# Patient Record
Sex: Female | Born: 1950 | Race: White | Hispanic: No | Marital: Single | Smoking: Current some day smoker
Health system: Southern US, Community
[De-identification: ages and names within clinical notes are randomized; demographics above are authoritative.]

## PROBLEM LIST (undated history)

## (undated) DIAGNOSIS — C3490 Malignant neoplasm of unspecified part of unspecified bronchus or lung: Secondary | ICD-10-CM

## (undated) DIAGNOSIS — B351 Tinea unguium: Secondary | ICD-10-CM

## (undated) DIAGNOSIS — L84 Corns and callosities: Secondary | ICD-10-CM

## (undated) DIAGNOSIS — E785 Hyperlipidemia, unspecified: Secondary | ICD-10-CM

## (undated) DIAGNOSIS — E079 Disorder of thyroid, unspecified: Secondary | ICD-10-CM

## (undated) DIAGNOSIS — E119 Type 2 diabetes mellitus without complications: Secondary | ICD-10-CM

## (undated) DIAGNOSIS — H04129 Dry eye syndrome of unspecified lacrimal gland: Secondary | ICD-10-CM

## (undated) DIAGNOSIS — H40023 Open angle with borderline findings, high risk, bilateral: Secondary | ICD-10-CM

## (undated) DIAGNOSIS — M1990 Unspecified osteoarthritis, unspecified site: Secondary | ICD-10-CM

## (undated) DIAGNOSIS — F319 Bipolar disorder, unspecified: Secondary | ICD-10-CM

## (undated) DIAGNOSIS — I10 Essential (primary) hypertension: Secondary | ICD-10-CM

## (undated) DIAGNOSIS — E7800 Pure hypercholesterolemia, unspecified: Secondary | ICD-10-CM

## (undated) HISTORY — DX: Malignant neoplasm of unspecified part of unspecified bronchus or lung: C34.90

## (undated) HISTORY — DX: Corns and callosities: L84

## (undated) HISTORY — DX: Hyperlipidemia, unspecified: E78.5

## (undated) HISTORY — DX: Tinea unguium: B35.1

## (undated) HISTORY — DX: Essential (primary) hypertension: I10

## (undated) HISTORY — PX: KNEE REPLACEMENT: SHX530B

## (undated) HISTORY — DX: Pure hypercholesterolemia, unspecified: E78.00

## (undated) HISTORY — DX: Dry eye syndrome of unspecified lacrimal gland: H04.129

## (undated) HISTORY — DX: Disorder of thyroid, unspecified: E07.9

## (undated) HISTORY — PX: CHOLECYSTECTOMY: SHX55

## (undated) HISTORY — PX: ECTOPIC PREGNANCY SURGERY: SHX613

## (undated) HISTORY — DX: Unspecified osteoarthritis, unspecified site: M19.90

## (undated) HISTORY — DX: Open angle with borderline findings, high risk, bilateral: H40.023

## (undated) HISTORY — PX: OTHER SURGICAL HISTORY: SHX169

## (undated) HISTORY — PX: TOE SURGERY: SHX1073

## (undated) HISTORY — DX: Type 2 diabetes mellitus without complications: E11.9

## (undated) HISTORY — DX: Bipolar disorder, unspecified: F31.9

## (undated) HISTORY — PX: CYST REMOVAL: SHX22

---

## 1988-08-01 DIAGNOSIS — S069XAA Unspecified intracranial injury with loss of consciousness status unknown, initial encounter: Secondary | ICD-10-CM

## 1988-08-01 HISTORY — DX: Unspecified intracranial injury with loss of consciousness status unknown, initial encounter: S06.9XAA

## 2010-03-12 ENCOUNTER — Encounter: Payer: Self-pay | Admitting: Gastroenterology

## 2010-03-12 ENCOUNTER — Ambulatory Visit: Payer: Self-pay | Admitting: Orthopedic Surgery

## 2010-03-26 ENCOUNTER — Ambulatory Visit: Payer: Self-pay | Admitting: Orthopedic Surgery

## 2010-04-23 ENCOUNTER — Ambulatory Visit: Payer: Self-pay | Admitting: Orthopedic Surgery

## 2010-04-28 ENCOUNTER — Ambulatory Visit: Payer: Self-pay | Admitting: Orthopedic Surgery

## 2010-08-01 HISTORY — PX: LUNG BIOPSY: SHX232

## 2010-08-01 HISTORY — PX: LUNG CANCER SURGERY: SHX702

## 2010-08-10 NOTE — Miscellaneous (Unsigned)
Continuity of Care Record  Created: todo  From: SHAHID, NASHIHA  From:   From: [employer], EHR v10.2.7.53  To: Ricki Miller  Purpose: Patient Use;       Medications  Ambien CR 12.5 MG Tablet Extended Release ; RPT   Avalide 150-12.5 MG Tablet ; RPT   Avapro 150 MG Tablet ; RPT   Byetta 10 MCG Pen 10 MCG/0.04ML Solution ; RPT   Calcium 600+D 600-400 MG-UNIT Tablet ; RPT   Cymbalta 60 MG Capsule Delayed Release Particles ; RPT   Glimepiride 2 MG Tablet ; RPT   Hydrochlorothiazide 12.5 MG Capsule ; RPT   Hydrocodone-Acetaminophen 10-325 MG Tablet ; RPT   Hydrocodone-Acetaminophen 10-500 MG Tablet ; RPT   LaMICtal Starter 25 (42)-100 (7) MG Kit ; RPT   Levothyroxine Sodium 112 MCG Tablet ; RPT   Maxalt-MLT 10 MG Tablet Dispersible ; RPT   MetFORMIN HCl 500 MG Tablet ; RPT   NovoFine 32G X 6 MM Miscellaneous ; RPT   Opana ER 10 MG Tablet Extended Release 12 Hour ; RPT   Opana ER 20 MG Tablet Extended Release 12 Hour ; RPT   Ropinirole HCl 1 MG Tablet ; RPT   Ropinirole HCl 2 MG Tablet ; RPT   Topiramate 100 MG Tablet ; RPT   Tricor 145 MG Tablet ; RPT   Vitamin D (Ergocalciferol) 50000 UNIT Capsule ; RPT   Warfarin Sodium 1 MG Tablet ; RPT   Warfarin Sodium 3 MG Tablet ; RPT   Warfarin Sodium 5 MG Tablet ; RPT   Zetia 10 MG Tablet ; RPT

## 2012-07-03 ENCOUNTER — Ambulatory Visit: Payer: Self-pay | Admitting: Surgery

## 2013-12-05 DIAGNOSIS — I080 Rheumatic disorders of both mitral and aortic valves: Secondary | ICD-10-CM | POA: Insufficient documentation

## 2015-09-03 DIAGNOSIS — R079 Chest pain, unspecified: Secondary | ICD-10-CM | POA: Insufficient documentation

## 2015-09-03 DIAGNOSIS — R0600 Dyspnea, unspecified: Secondary | ICD-10-CM | POA: Insufficient documentation

## 2016-09-14 DIAGNOSIS — Z8249 Family history of ischemic heart disease and other diseases of the circulatory system: Secondary | ICD-10-CM | POA: Insufficient documentation

## 2018-01-07 DIAGNOSIS — R7989 Other specified abnormal findings of blood chemistry: Secondary | ICD-10-CM | POA: Insufficient documentation

## 2018-01-07 DIAGNOSIS — E042 Nontoxic multinodular goiter: Secondary | ICD-10-CM | POA: Insufficient documentation

## 2018-02-21 DIAGNOSIS — R42 Dizziness and giddiness: Secondary | ICD-10-CM | POA: Insufficient documentation

## 2018-03-28 DIAGNOSIS — R918 Other nonspecific abnormal finding of lung field: Secondary | ICD-10-CM | POA: Insufficient documentation

## 2018-06-21 DIAGNOSIS — H6123 Impacted cerumen, bilateral: Secondary | ICD-10-CM | POA: Insufficient documentation

## 2018-06-21 DIAGNOSIS — J309 Allergic rhinitis, unspecified: Secondary | ICD-10-CM | POA: Insufficient documentation

## 2018-09-25 ENCOUNTER — Encounter: Payer: Self-pay | Admitting: Podiatry

## 2018-09-25 ENCOUNTER — Other Ambulatory Visit: Payer: Self-pay

## 2018-09-25 ENCOUNTER — Ambulatory Visit (INDEPENDENT_AMBULATORY_CARE_PROVIDER_SITE_OTHER): Payer: Managed Care, Other (non HMO) | Admitting: Podiatry

## 2018-09-25 VITALS — BP 105/78 | HR 91

## 2018-09-25 DIAGNOSIS — M79675 Pain in left toe(s): Secondary | ICD-10-CM

## 2018-09-25 DIAGNOSIS — B351 Tinea unguium: Secondary | ICD-10-CM

## 2018-09-25 DIAGNOSIS — M79674 Pain in right toe(s): Secondary | ICD-10-CM

## 2018-09-25 DIAGNOSIS — L84 Corns and callosities: Secondary | ICD-10-CM

## 2018-09-25 NOTE — Patient Instructions (Signed)
Onychomycosis/Fungal Toenails  WHAT IS IT? An infection that lies within the keratin of your nail plate that is caused by a fungus.  WHY ME? Fungal infections affect all ages, sexes, races, and creeds.  There may be many factors that predispose you to a fungal infection such as age, coexisting medical conditions such as diabetes, or an autoimmune disease; stress, medications, fatigue, genetics, etc.  Bottom line: fungus thrives in a warm, moist environment and your shoes offer such a location.  IS IT CONTAGIOUS? Theoretically, yes.  You do not want to share shoes, nail clippers or files with someone who has fungal toenails.  Walking around barefoot in the same room or sleeping in the same bed is unlikely to transfer the organism.  It is important to realize, however, that fungus can spread easily from one nail to the next on the same foot.  HOW DO WE TREAT THIS?  There are several ways to treat this condition.  Treatment may depend on many factors such as age, medications, pregnancy, liver and kidney conditions, etc.  It is best to ask your doctor which options are available to you.  1. No treatment.   Unlike many other medical concerns, you can live with this condition.  However for many people this can be a painful condition and may lead to ingrown toenails or a bacterial infection.  It is recommended that you keep the nails cut short to help reduce the amount of fungal nail. 2. Topical treatment.  These range from herbal remedies to prescription strength nail lacquers.  About 40-50% effective, topicals require twice daily application for approximately 9 to 12 months or until an entirely new nail has grown out.  The most effective topicals are medical grade medications available through physicians offices. 3. Oral antifungal medications.  With an 80-90% cure rate, the most common oral medication requires 3 to 4 months of therapy and stays in your system for a year as the new nail grows out.  Oral  antifungal medications do require blood work to make sure it is a safe drug for you.  A liver function panel will be performed prior to starting the medication and after the first month of treatment.  It is important to have the blood work performed to avoid any harmful side effects.  In general, this medication safe but blood work is required. 4. Laser Therapy.  This treatment is performed by applying a specialized laser to the affected nail plate.  This therapy is noninvasive, fast, and non-painful.  It is not covered by insurance and is therefore, out of pocket.  The results have been very good with a 80-95% cure rate.  [facility] is the only practice in the area to offer this therapy. Permanent Nail Avulsion.  Removing the entire nail so that a new nail will not grow back.Corns and Calluses Corns are small areas of thickened skin that occur on the top, sides, or tip of a toe. They contain a cone-shaped core with a point that can press on a nerve below. This causes pain.  Calluses are areas of thickened skin that can occur anywhere on the body, including the hands, fingers, palms, soles of the feet, and heels. Calluses are usually larger than corns. What are the causes? Corns and calluses are caused by rubbing (friction) or pressure, such as from shoes that are too tight or do not fit properly. What increases the risk? Corns are more likely to develop in people who have misshapen   toes (toe deformities), such as hammer toes. Calluses can occur with friction to any area of the skin. They are more likely to develop in people who:  Work with their hands.  Wear shoes that fit poorly, are too tight, or are high-heeled.  Have toe deformities. What are the signs or symptoms? Symptoms of a corn or callus include:  A hard growth on the skin.  Pain or tenderness under the skin.  Redness and swelling.  Increased discomfort while wearing tight-fitting shoes, if your feet are affected. If a  corn or callus becomes infected, symptoms may include:  Redness and swelling that gets worse.  Pain.  Fluid, blood, or pus draining from the corn or callus. How is this diagnosed? Corns and calluses may be diagnosed based on your symptoms, your medical history, and a physical exam. How is this treated? Treatment for corns and calluses may include:  Removing the cause of the friction or pressure. This may involve: ? Changing your shoes. ? Wearing shoe inserts (orthotics) or other protective layers in your shoes, such as a corn pad. ? Wearing gloves.  Applying medicine to the skin (topical medicine) to help soften skin in the hardened, thickened areas.  Removing layers of dead skin with a file to reduce the size of the corn or callus.  Removing the corn or callus with a scalpel or laser.  Taking antibiotic medicines, if your corn or callus is infected.  Having surgery, if a toe deformity is the cause. Follow these instructions at home:   Take over-the-counter and prescription medicines only as told by your health care provider.  If you were prescribed an antibiotic, take it as told by your health care provider. Do not stop taking it even if your condition starts to improve.  Wear shoes that fit well. Avoid wearing high-heeled shoes and shoes that are too tight or too loose.  Wear any padding, protective layers, gloves, or orthotics as told by your health care provider.  Soak your hands or feet and then use a file or pumice stone to soften your corn or callus. Do this as told by your health care provider.  Check your corn or callus every day for symptoms of infection. Contact a health care provider if you:  Notice that your symptoms do not improve with treatment.  Have redness or swelling that gets worse.  Notice that your corn or callus becomes painful.  Have fluid, blood, or pus coming from your corn or callus.  Have new symptoms. Summary  Corns are small areas of  thickened skin that occur on the top, sides, or tip of a toe.  Calluses are areas of thickened skin that can occur anywhere on the body, including the hands, fingers, palms, and soles of the feet. Calluses are usually larger than corns.  Corns and calluses are caused by rubbing (friction) or pressure, such as from shoes that are too tight or do not fit properly.  Treatment may include wearing any padding, protective layers, gloves, or orthotics as told by your health care provider. This information is not intended to replace advice given to you by your health care provider. Make sure you discuss any questions you have with your health care provider. Document Released: 04/23/2004 Document Revised: 05/31/2017 Document Reviewed: 05/31/2017 Elsevier Interactive Patient Education  2019 Elsevier Inc.  

## 2018-10-01 ENCOUNTER — Encounter: Payer: Self-pay | Admitting: Podiatry

## 2018-10-01 DIAGNOSIS — B351 Tinea unguium: Secondary | ICD-10-CM

## 2018-10-01 DIAGNOSIS — L84 Corns and callosities: Secondary | ICD-10-CM

## 2018-10-01 HISTORY — DX: Tinea unguium: B35.1

## 2018-10-01 HISTORY — DX: Corns and callosities: L84

## 2018-10-01 NOTE — Progress Notes (Signed)
Subjective: Mindy King presents today for further evaluation.  Patient presents with painful calluses of both feet which she has had for decades.  She was receiving podiatric care in [place] periodically for her painful calluses.  She has moved to [place] from [place] to be closer to her family.  Patient states she has history of lung cancer diagnosed approximately 8 years ago.  She had no chemotherapy and no radiation.  She underwent surgery to remove her lung cancer in 2012.  She states she is open and honest and does have a cigarette still every now and again.    Current Outpatient Medications:  .  albuterol (PROVENTIL HFA;VENTOLIN HFA) 108 (90 Base) MCG/ACT inhaler, Inhale into the lungs every 6 (six) hours as needed for wheezing or shortness of breath., Disp: , Rfl:  .  atorvastatin (LIPITOR) 20 MG tablet, Take 20 mg by mouth daily., Disp: , Rfl:  .  montelukast (SINGULAIR) 10 MG tablet, Take 10 mg by mouth at bedtime., Disp: , Rfl:  .  Multiple Vitamin (DAILY-VITE) TABS, TK 1 T PO QD, Disp: , Rfl:  .  SPIRIVA HANDIHALER 18 MCG inhalation capsule, PLACE 1 C INTO INHALER AND INL D, Disp: , Rfl:    Allergies  Allergen Reactions  . Ampicillin      Social History   Occupational History  . Not on file  Tobacco Use  . Smoking status: Not on file  Substance and Sexual Activity  . Alcohol use: Not on file  . Drug use: Not on file  . Sexual activity: Not on file     History reviewed. No pertinent family history.    There is no immunization history on file for this patient.   Review of systems: Positive Findings in bold print.  Constitutional:  chills, fatigue, fever, sweats, weight change Communication: Optometrist, sign Ecologist, hand writing, iPad/Android device Head: headaches, head injury Eyes: changes in vision, eye pain, glaucoma, cataracts, macular degeneration, diplopia, glare,  light sensitivity, eyeglasses or contacts, blindness Ears nose mouth  throat: Hard of hearing, ringing in ears, deaf, sign language,  vertigo,   nosebleeds,  rhinitis,  cold sores, snoring, swollen glands Cardiovascular: HTN, edema, arrhythmia, pacemaker in place, defibrillator in place,  chest pain/tightness, chronic anticoagulation, blood clot, heart failure Peripheral Vascular: leg cramps, varicose veins, blood clots, lymphedema Respiratory:  difficulty breathing, denies congestion, SOB, wheezing, cough, emphysema Gastrointestinal: change in appetite or weight, abdominal pain, constipation, diarrhea, nausea, vomiting, vomiting blood, change in bowel habits, abdominal pain, jaundice, rectal bleeding, hemorrhoids, Genitourinary:  nocturia,  pain on urination,  blood in urine, Foley catheter, urinary urgency Musculoskeletal: uses mobility aid,  cramping, stiff joints, painful joints, decreased joint motion, fractures, OA, gout Skin: +changes in toenails, color change, dryness, itching, mole changes,  rash, multiple corns/calluses both feet Neurological: headaches, numbness in feet, paresthesias in feet, burning in feet, fainting,  seizures, change in speech. denies headaches, memory problems/poor historian, cerebral palsy, weakness, paralysis Endocrine: diabetes, hypothyroidism, hyperthyroidism,  goiter, dry mouth, flushing, heat intolerance,  cold intolerance,  excessive thirst, denies polyuria,  nocturia Hematological:  easy bleeding, excessive bleeding, easy bruising, enlarged lymph nodes, on long term blood thinner, history of past transusions Allergy/immunological:  hives, eczema, frequent infections, multiple drug allergies, seasonal allergies, transplant recipient Psychiatric:  anxiety, depression, mood disorder, suicidal ideations, hallucinations   Objective: Vitals:   09/25/18 1521  BP: 105/78  Pulse: 91   Vascular Examination: Capillary refill time <3 seconds x 10 digits  Dorsalis  pedis and posterior tibial pulses present b/l.  Sparse digital hair x  10 digits.  Skin temperature warm to warm b/l.  Dermatological Examination: Skin with normal turgor, texture and tone.  Toenails noted to be thickened, discolored, dystrophic with subungual debris x10.  There is tenderness to dorsal palpation.  Multiple hyperkeratotic lesions noted medial aspect left second digit, submetatarsal head 4 right foot, sub-hallux bilaterally, submetatarsal head 1 right foot, medial and lateral aspect of right second digit, medial aspect of right third digit.  There is tenderness palpation of all of these lesions, especially plantar lesions.  There is no erythema, no edema, no drainage, no flocculence noted.  Musculoskeletal: Muscle strength 5/5 to all LE muscle groups.  Hammertoe deformity digits 2 through 4 bilaterally.  Hallux abductovalgus with bunion deformity bilaterally.  Neurological: Sensation intact with 10 gram monofilament. Vibratory sensation intact.  Assessment: 1. Painful mycotic toenails 1 through 5 bilaterally 2. Painful calluses submetatarsal head 4 right, sub-hallux bilaterally, submetatarsal head 1 right foot 3. Painful corns bilateral second digits and right third digit  Plan: 1. Discussed diagnoses and treatment plans available.  Patient gave verbal verbal consent for debridement of painful toenails and painful corns and calluses. 2. Toenails 1 through 5 bilaterally debrided in length and girth without incident. 3. Painful calluses submetatarsal head 4 right foot sub-hallux bilaterally submetatarsal head 1 right foot pared with sterile scalpel blade.  Light bleeding to lesion submetatarsal head 4 right foot addressed with Lumicain hemostatic solution.  Antibiotic ointment applied to this area.  Painful corns bilateral second digits and right third digit pared with sterile scalpel blade without incident. 4. Patient to continue soft, supportive shoe gear daily. 5. Patient to report any pedal injuries to medical professional  immediately. 6. Follow up 9 weeks.  Patient/POA to call should there be a concern in the interim.

## 2018-11-29 ENCOUNTER — Ambulatory Visit: Payer: Managed Care, Other (non HMO) | Admitting: Podiatry

## 2018-12-13 DIAGNOSIS — Z720 Tobacco use: Secondary | ICD-10-CM | POA: Insufficient documentation

## 2018-12-13 DIAGNOSIS — E782 Mixed hyperlipidemia: Secondary | ICD-10-CM | POA: Insufficient documentation

## 2018-12-13 DIAGNOSIS — E041 Nontoxic single thyroid nodule: Secondary | ICD-10-CM | POA: Insufficient documentation

## 2018-12-13 DIAGNOSIS — C3432 Malignant neoplasm of lower lobe, left bronchus or lung: Secondary | ICD-10-CM | POA: Insufficient documentation

## 2018-12-13 DIAGNOSIS — J439 Emphysema, unspecified: Secondary | ICD-10-CM | POA: Insufficient documentation

## 2018-12-25 DIAGNOSIS — J387 Other diseases of larynx: Secondary | ICD-10-CM | POA: Insufficient documentation

## 2018-12-31 ENCOUNTER — Ambulatory Visit (INDEPENDENT_AMBULATORY_CARE_PROVIDER_SITE_OTHER): Payer: Commercial Indemnity | Admitting: Podiatry

## 2018-12-31 ENCOUNTER — Encounter: Payer: Self-pay | Admitting: Podiatry

## 2018-12-31 DIAGNOSIS — M79674 Pain in right toe(s): Secondary | ICD-10-CM

## 2018-12-31 DIAGNOSIS — M79675 Pain in left toe(s): Secondary | ICD-10-CM | POA: Diagnosis not present

## 2018-12-31 DIAGNOSIS — L84 Corns and callosities: Secondary | ICD-10-CM | POA: Diagnosis not present

## 2018-12-31 DIAGNOSIS — B351 Tinea unguium: Secondary | ICD-10-CM

## 2018-12-31 NOTE — Patient Instructions (Signed)
Corns and Calluses Corns are small areas of thickened skin that occur on the top, sides, or tip of a toe. They contain a cone-shaped core with a point that can press on a nerve below. This causes pain.  Calluses are areas of thickened skin that can occur anywhere on the body, including the hands, fingers, palms, soles of the feet, and heels. Calluses are usually larger than corns. What are the causes? Corns and calluses are caused by rubbing (friction) or pressure, such as from shoes that are too tight or do not fit properly. What increases the risk? Corns are more likely to develop in people who have misshapen toes (toe deformities), such as hammer toes. Calluses can occur with friction to any area of the skin. They are more likely to develop in people who:  Work with their hands.  Wear shoes that fit poorly, are too tight, or are high-heeled.  Have toe deformities. What are the signs or symptoms? Symptoms of a corn or callus include:  A hard growth on the skin.  Pain or tenderness under the skin.  Redness and swelling.  Increased discomfort while wearing tight-fitting shoes, if your feet are affected. If a corn or callus becomes infected, symptoms may include:  Redness and swelling that gets worse.  Pain.  Fluid, blood, or pus draining from the corn or callus. How is this diagnosed? Corns and calluses may be diagnosed based on your symptoms, your medical history, and a physical exam. How is this treated? Treatment for corns and calluses may include:  Removing the cause of the friction or pressure. This may involve: ? Changing your shoes. ? Wearing shoe inserts (orthotics) or other protective layers in your shoes, such as a corn pad. ? Wearing gloves.  Applying medicine to the skin (topical medicine) to help soften skin in the hardened, thickened areas.  Removing layers of dead skin with a file to reduce the size of the corn or callus.  Removing the corn or callus with a  scalpel or laser.  Taking antibiotic medicines, if your corn or callus is infected.  Having surgery, if a toe deformity is the cause. Follow these instructions at home:   Take over-the-counter and prescription medicines only as told by your health care provider.  If you were prescribed an antibiotic, take it as told by your health care provider. Do not stop taking it even if your condition starts to improve.  Wear shoes that fit well. Avoid wearing high-heeled shoes and shoes that are too tight or too loose.  Wear any padding, protective layers, gloves, or orthotics as told by your health care provider.  Soak your hands or feet and then use a file or pumice stone to soften your corn or callus. Do this as told by your health care provider.  Check your corn or callus every day for symptoms of infection. Contact a health care provider if you:  Notice that your symptoms do not improve with treatment.  Have redness or swelling that gets worse.  Notice that your corn or callus becomes painful.  Have fluid, blood, or pus coming from your corn or callus.  Have new symptoms. Summary  Corns are small areas of thickened skin that occur on the top, sides, or tip of a toe.  Calluses are areas of thickened skin that can occur anywhere on the body, including the hands, fingers, palms, and soles of the feet. Calluses are usually larger than corns.  Corns and calluses are caused by   rubbing (friction) or pressure, such as from shoes that are too tight or do not fit properly.  Treatment may include wearing any padding, protective layers, gloves, or orthotics as told by your health care provider. This information is not intended to replace advice given to you by your health care provider. Make sure you discuss any questions you have with your health care provider. Document Released: 04/23/2004 Document Revised: 05/31/2017 Document Reviewed: 05/31/2017 Elsevier Interactive Patient Education   2019 [employer] Inc.  Onychomycosis/Fungal Toenails  WHAT IS IT? An infection that lies within the keratin of your nail plate that is caused by a fungus.  WHY ME? Fungal infections affect all ages, sexes, races, and creeds.  There may be many factors that predispose you to a fungal infection such as age, coexisting medical conditions such as diabetes, or an autoimmune disease; stress, medications, fatigue, genetics, etc.  Bottom line: fungus thrives in a warm, moist environment and your shoes offer such a location.  IS IT CONTAGIOUS? Theoretically, yes.  You do not want to share shoes, nail clippers or files with someone who has fungal toenails.  Walking around barefoot in the same room or sleeping in the same bed is unlikely to transfer the organism.  It is important to realize, however, that fungus can spread easily from one nail to the next on the same foot.  HOW DO WE TREAT THIS?  There are several ways to treat this condition.  Treatment may depend on many factors such as age, medications, pregnancy, liver and kidney conditions, etc.  It is best to ask your doctor which options are available to you.  1. No treatment.   Unlike many other medical concerns, you can live with this condition.  However for many people this can be a painful condition and may lead to ingrown toenails or a bacterial infection.  It is recommended that you keep the nails cut short to help reduce the amount of fungal nail. 2. Topical treatment.  These range from herbal remedies to prescription strength nail lacquers.  About 40-50% effective, topicals require twice daily application for approximately 9 to 12 months or until an entirely new nail has grown out.  The most effective topicals are medical grade medications available through physicians offices. 3. Oral antifungal medications.  With an 80-90% cure rate, the most common oral medication requires 3 to 4 months of therapy and stays in your system for a year as the new nail  grows out.  Oral antifungal medications do require blood work to make sure it is a safe drug for you.  A liver function panel will be performed prior to starting the medication and after the first month of treatment.  It is important to have the blood work performed to avoid any harmful side effects.  In general, this medication safe but blood work is required. 4. Laser Therapy.  This treatment is performed by applying a specialized laser to the affected nail plate.  This therapy is noninvasive, fast, and non-painful.  It is not covered by insurance and is therefore, out of pocket.  The results have been very good with a 80-95% cure rate.  [facility] is the only practice in the area to offer this therapy. 5. Permanent Nail Avulsion.  Removing the entire nail so that a new nail will not grow back. 

## 2019-01-05 NOTE — Progress Notes (Signed)
Subjective: Mindy King presents to clinic with cc of painful mycotic toenails and calluses bilaterally which are aggravated when weightbearing with and without shoe gear.  This pain limits her daily activities. Pain symptoms resolve with periodic professional debridement.  She voices no new pedal concerns on today's visit.   Current Outpatient Medications:  .  aspirin EC 81 MG tablet, Take by mouth., Disp: , Rfl:  .  albuterol (PROVENTIL HFA;VENTOLIN HFA) 108 (90 Base) MCG/ACT inhaler, Inhale into the lungs every 6 (six) hours as needed for wheezing or shortness of breath., Disp: , Rfl:  .  atorvastatin (LIPITOR) 20 MG tablet, Take 20 mg by mouth daily., Disp: , Rfl:  .  montelukast (SINGULAIR) 10 MG tablet, Take 10 mg by mouth at bedtime., Disp: , Rfl:  .  Multiple Vitamin (DAILY-VITE) TABS, TK 1 T PO QD, Disp: , Rfl:  .  SPIRIVA HANDIHALER 18 MCG inhalation capsule, PLACE 1 C INTO INHALER AND INL D, Disp: , Rfl:    Allergies  Allergen Reactions  . Ampicillin      Objective:  Physical Examination:  Vascular  Examination: Capillary refill time <3 seconds x 10 digits.  Palpable DP/PT pulses b/l.  Digital hair sparse b/l.  No edema noted b/l.  Skin temperature gradient WNL b/l.  Dermatological Examination: Skin with normal turgor, texture and tone b/l.  No open wounds b/l.  No interdigital macerations noted b/l.  Elongated, thick, discolored brittle toenails with subungual debris and pain on dorsal palpation of nailbeds 1-5 b/l.  Hyperkeratotic lesions noted medial left 2nd digit, submet head 4 right foot, subhallux b/l, submet head 1 right foot, medial/lateral aspect right 2nd digit, medial aspect right 3rd digit. All tender to palpation. No erythema, no edema, no drainage, no flocculence.  Musculoskeletal Examination: Muscle strength 5/5 to all muscle groups b/l.  Hammertoe deformity digits 2-4 b/l.  HAV with bunion b/l.  No pain, crepitus or joint discomfort  with active/passive ROM.  Neurological Examination: Sensation intact 5/5 b/l with 10 gram monofilament.  Vibratory sensation intact b/l.  Proprioceptive sensation intact b/l.  Assessment: 1. 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 Corns b/l 2nd digits and right 3rd digit 3. Callus submet head 4 right, subhallux b/l, submet head 1 right foot  Plan: 1. Toenails 1-5 b/l were debrided in length and girth without iatrogenic laceration. 2. Painful hyperkeratotic lesions pared  b/l 2nd digits and right 3rd digit, submet head 4 right, subhallux b/l, submet head 1 right foot. 3. Continue soft, supportive shoe gear daily. 4. Report any pedal injuries to medical professional. 5. Follow up 9 weeks. 6. Patient/POA to call should there be a question/concern in there interim.

## 2019-01-09 LAB — UNMAPPED LAB RESULTS
Basophil # (HT): 0.1 10 3/uL (ref 0.0–0.2)
Basophil % (HT): 1 % (ref 0–3)
Eosinophil # (HT): 0.2 10 3/uL (ref 0.1–0.6)
Eosinophil % (HT): 4 % (ref 0–5)
Hematocrit (HT): 45 % (ref 35–47)
Hemoglobin (HGB) (HT): 14.7 g/dL (ref 12.0–16.0)
Lymphocyte # (HT): 1.7 10 3/uL (ref 1.0–4.8)
Lymphocyte % (HT): 32 % (ref 15–45)
MCHC (HT): 32.7 g/dL (ref 31.0–37.5)
MCV (HT): 87 fL (ref 80–100)
Mean Corpuscular Hemoglobin (MCH) (HT): 28.5 pg (ref 26.0–34.0)
Monocyte # (HT): 0.4 10 3/uL (ref 0.1–1.0)
Monocyte % (HT): 8 % (ref 0–15)
Neutrophil # (HT): 2.8 10 3/uL (ref 1.8–8.0)
Platelets (HT): 197 10 3/uL (ref 150–450)
RBC (HT): 5.16 10 6/uL (ref 3.80–5.20)
RDW (HT): 13.7 % (ref 9.0–15.2)
Seg Neut % (HT): 54 % (ref 45–75)
WBC (HT): 5.2 10 3/uL (ref 4.0–11.0)

## 2019-01-22 DIAGNOSIS — M810 Age-related osteoporosis without current pathological fracture: Secondary | ICD-10-CM | POA: Insufficient documentation

## 2019-03-04 ENCOUNTER — Other Ambulatory Visit: Payer: Self-pay

## 2019-03-04 ENCOUNTER — Encounter: Payer: Self-pay | Admitting: Podiatry

## 2019-03-04 ENCOUNTER — Ambulatory Visit (INDEPENDENT_AMBULATORY_CARE_PROVIDER_SITE_OTHER): Payer: Commercial Indemnity | Admitting: Podiatry

## 2019-03-04 ENCOUNTER — Ambulatory Visit (INDEPENDENT_AMBULATORY_CARE_PROVIDER_SITE_OTHER): Payer: Medicare Other | Admitting: Orthotics

## 2019-03-04 VITALS — Temp 97.6°F

## 2019-03-04 DIAGNOSIS — M2142 Flat foot [pes planus] (acquired), left foot: Secondary | ICD-10-CM | POA: Diagnosis not present

## 2019-03-04 DIAGNOSIS — M79674 Pain in right toe(s): Secondary | ICD-10-CM

## 2019-03-04 DIAGNOSIS — B351 Tinea unguium: Secondary | ICD-10-CM | POA: Diagnosis not present

## 2019-03-04 DIAGNOSIS — M79675 Pain in left toe(s): Secondary | ICD-10-CM | POA: Diagnosis not present

## 2019-03-04 DIAGNOSIS — M2141 Flat foot [pes planus] (acquired), right foot: Secondary | ICD-10-CM | POA: Diagnosis not present

## 2019-03-04 DIAGNOSIS — L84 Corns and callosities: Secondary | ICD-10-CM

## 2019-03-04 NOTE — Progress Notes (Signed)
Patient was seen today for offloading painful plantar fibromas/keratomas.  Area of concerned was marked and patient was scanned/cast to offload the keratoma/fibroma.  A LW accomodative device will be fabricated for the patient with appropriate offloads.  

## 2019-03-04 NOTE — Progress Notes (Signed)
Subjective: Mindy King presents to clinic with cc of painful mycotic toenails and corns/calluses of both feet.  She states her pain is so bad, it is affecting her quality of life.  Her calluses are aggravated when weightbearing with and without shoe gear.  This pain limits her daily activities. Pain symptoms resolve with periodic professional debridement.   Current Outpatient Medications:  .  albuterol (PROVENTIL HFA;VENTOLIN HFA) 108 (90 Base) MCG/ACT inhaler, Inhale into the lungs every 6 (six) hours as needed for wheezing or shortness of breath., Disp: , Rfl:  .  aspirin EC 81 MG tablet, Take by mouth., Disp: , Rfl:  .  atorvastatin (LIPITOR) 20 MG tablet, Take 20 mg by mouth daily., Disp: , Rfl:  .  B Complex-C (B-COMPLEX WITH VITAMIN C) tablet, Take by mouth., Disp: , Rfl:  .  montelukast (SINGULAIR) 10 MG tablet, Take 10 mg by mouth at bedtime., Disp: , Rfl:  .  Multiple Vitamin (DAILY-VITE) TABS, TK 1 T PO QD, Disp: , Rfl:  .  SPIRIVA HANDIHALER 18 MCG inhalation capsule, PLACE 1 C INTO INHALER AND INL D, Disp: , Rfl:    Allergies  Allergen Reactions  . Ampicillin      Objective: Vitals:   03/04/19 0832  Temp: 97.6 F (36.4 C)    Physical Examination:  Vascular  Examination: Capillary refill time <3 seconds x 10 digits.  Palpable DP/PT pulses b/l.  Digital hair sparse b/l.  No edema noted b/l.  Skin temperature gradient WNL b/l.  Dermatological Examination: Skin with normal turgor, texture and tone b/l.  No open wounds b/l.  No interdigital macerations noted b/l.  Elongated, thick, discolored brittle toenails with subungual debris and pain on dorsal palpation of nailbeds 1-5 b/l.  Hyperkeratotic lesion submet head 4 right foot, subhallux b/l submet head 1 right foot, medial left 2nd digit, medial/lateral aspect right 2nd digit, medial aspect right 3rd digit  with tenderness to palpation. No edema, no erythema, no drainage, no flocculence.  Musculoskeletal  Examination: Muscle strength 5/5 to all muscle groups b/l.  Hammertoes 2-4 b/l.  HAV with bunion b/l  No pain, crepitus or joint discomfort with active/passive ROM.  Neurological Examination: Sensation intact 5/5 b/l with 10 gram monofilament.  Vibratory sensation intact b/l.  Proprioceptive sensation intact b/l.  Assessment: 1. 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 Corns b/l  2nd digits and right 3rd digit 3. Calluses submet head 4 right foot, subhallux b/l submet head 1 right foot 4. HAV with bunion b/l 5. Hammertoes 2-4 b/l 6. Pain in feet  Plan: 1. Mindy King states she will call her insurance company to discuss frequency of visits. Her pain is valid considering the number of lesions she has. I did discuss this with her and if there is anything I can do to support her, I will. In the meantime, we will schedule her for 9 weeks. 2. We did have her see our Pedorthist for orthotic casting. This will address her plantar lesions, but not her interdigital lesions. She states padding does not work for her.  3. Toenails 1-5 b/l were debrided in length and girth without iatrogenic laceration. 4. Calluses pared submetatarsal heads submet head 4 right foot, subhallux b/l submet head 1 right foot utilizing sterile scalpel blade without incident. 5. Corn(s) pared b/l 2nd digits and right 3rd digit utilizing sterile scalpel blade without incident. 6. Continue soft, supportive shoe gear daily. 7. Report any pedal injuries to medical professional immediately. 8. Follow up 9  weeks. 9. Patient/POA to call should there be a question/concern in there interim.

## 2019-03-04 NOTE — Patient Instructions (Addendum)
Onychomycosis/Fungal Toenails  WHAT IS IT? An infection that lies within the keratin of your nail plate that is caused by a fungus.  WHY ME? Fungal infections affect all ages, sexes, races, and creeds.  There may be many factors that predispose you to a fungal infection such as age, coexisting medical conditions such as diabetes, or an autoimmune disease; stress, medications, fatigue, genetics, etc.  Bottom line: fungus thrives in a warm, moist environment and your shoes offer such a location.  IS IT CONTAGIOUS? Theoretically, yes.  You do not want to share shoes, nail clippers or files with someone who has fungal toenails.  Walking around barefoot in the same room or sleeping in the same bed is unlikely to transfer the organism.  It is important to realize, however, that fungus can spread easily from one nail to the next on the same foot.  HOW DO WE TREAT THIS?  There are several ways to treat this condition.  Treatment may depend on many factors such as age, medications, pregnancy, liver and kidney conditions, etc.  It is best to ask your doctor which options are available to you.  1. No treatment.   Unlike many other medical concerns, you can live with this condition.  However for many people this can be a painful condition and may lead to ingrown toenails or a bacterial infection.  It is recommended that you keep the nails cut short to help reduce the amount of fungal nail. 2. Topical treatment.  These range from herbal remedies to prescription strength nail lacquers.  About 40-50% effective, topicals require twice daily application for approximately 9 to 12 months or until an entirely new nail has grown out.  The most effective topicals are medical grade medications available through physicians offices. 3. Oral antifungal medications.  With an 80-90% cure rate, the most common oral medication requires 3 to 4 months of therapy and stays in your system for a year as the new nail grows out.  Oral  antifungal medications do require blood work to make sure it is a safe drug for you.  A liver function panel will be performed prior to starting the medication and after the first month of treatment.  It is important to have the blood work performed to avoid any harmful side effects.  In general, this medication safe but blood work is required. 4. Laser Therapy.  This treatment is performed by applying a specialized laser to the affected nail plate.  This therapy is noninvasive, fast, and non-painful.  It is not covered by insurance and is therefore, out of pocket.  The results have been very good with a 80-95% cure rate.  [facility] is the only practice in the area to offer this therapy. 5. Permanent Nail Avulsion.  Removing the entire nail so that a new nail will not grow back.  Corns and Calluses Corns are small areas of thickened skin that occur on the top, sides, or tip of a toe. They contain a cone-shaped core with a point that can press on a nerve below. This causes pain.  Calluses are areas of thickened skin that can occur anywhere on the body, including the hands, fingers, palms, soles of the feet, and heels. Calluses are usually larger than corns. What are the causes? Corns and calluses are caused by rubbing (friction) or pressure, such as from shoes that are too tight or do not fit properly. What increases the risk? Corns are more likely to develop in people  who have misshapen toes (toe deformities), such as hammer toes. Calluses can occur with friction to any area of the skin. They are more likely to develop in people who:  Work with their hands.  Wear shoes that fit poorly, are too tight, or are high-heeled.  Have toe deformities. What are the signs or symptoms? Symptoms of a corn or callus include:  A hard growth on the skin.  Pain or tenderness under the skin.  Redness and swelling.  Increased discomfort while wearing tight-fitting shoes, if your feet are  affected. If a corn or callus becomes infected, symptoms may include:  Redness and swelling that gets worse.  Pain.  Fluid, blood, or pus draining from the corn or callus. How is this diagnosed? Corns and calluses may be diagnosed based on your symptoms, your medical history, and a physical exam. How is this treated? Treatment for corns and calluses may include:  Removing the cause of the friction or pressure. This may involve: ? Changing your shoes. ? Wearing shoe inserts (orthotics) or other protective layers in your shoes, such as a corn pad. ? Wearing gloves.  Applying medicine to the skin (topical medicine) to help soften skin in the hardened, thickened areas.  Removing layers of dead skin with a file to reduce the size of the corn or callus.  Removing the corn or callus with a scalpel or laser.  Taking antibiotic medicines, if your corn or callus is infected.  Having surgery, if a toe deformity is the cause. Follow these instructions at home:   Take over-the-counter and prescription medicines only as told by your health care provider.  If you were prescribed an antibiotic, take it as told by your health care provider. Do not stop taking it even if your condition starts to improve.  Wear shoes that fit well. Avoid wearing high-heeled shoes and shoes that are too tight or too loose.  Wear any padding, protective layers, gloves, or orthotics as told by your health care provider.  Soak your hands or feet and then use a file or pumice stone to soften your corn or callus. Do this as told by your health care provider.  Check your corn or callus every day for symptoms of infection. Contact a health care provider if you:  Notice that your symptoms do not improve with treatment.  Have redness or swelling that gets worse.  Notice that your corn or callus becomes painful.  Have fluid, blood, or pus coming from your corn or callus.  Have new symptoms. Summary  Corns are  small areas of thickened skin that occur on the top, sides, or tip of a toe.  Calluses are areas of thickened skin that can occur anywhere on the body, including the hands, fingers, palms, and soles of the feet. Calluses are usually larger than corns.  Corns and calluses are caused by rubbing (friction) or pressure, such as from shoes that are too tight or do not fit properly.  Treatment may include wearing any padding, protective layers, gloves, or orthotics as told by your health care provider. This information is not intended to replace advice given to you by your health care provider. Make sure you discuss any questions you have with your health care provider. Document Released: 04/23/2004 Document Revised: 11/07/2018 Document Reviewed: 05/31/2017 Elsevier Patient Education  2020 [employer].

## 2019-03-19 ENCOUNTER — Other Ambulatory Visit: Payer: Self-pay | Admitting: Nurse Practitioner

## 2019-03-19 DIAGNOSIS — Z1231 Encounter for screening mammogram for malignant neoplasm of breast: Secondary | ICD-10-CM

## 2019-03-21 DIAGNOSIS — R634 Abnormal weight loss: Secondary | ICD-10-CM | POA: Insufficient documentation

## 2019-03-25 ENCOUNTER — Other Ambulatory Visit: Payer: Self-pay

## 2019-03-25 ENCOUNTER — Ambulatory Visit: Payer: Medicare Other | Admitting: Orthotics

## 2019-03-25 DIAGNOSIS — L84 Corns and callosities: Secondary | ICD-10-CM

## 2019-03-25 NOTE — Progress Notes (Signed)
Patient came in today to pick up custom made foot orthotics.  The goals were accomplished and the patient reported no dissatisfaction with said orthotics.  Patient was advised of breakin period and how to report any issues. 

## 2019-03-29 ENCOUNTER — Ambulatory Visit: Payer: Medicaid Other | Admitting: Cardiology

## 2019-04-19 ENCOUNTER — Ambulatory Visit: Payer: Medicaid Other | Admitting: Cardiology

## 2019-05-03 ENCOUNTER — Ambulatory Visit: Payer: Medicare Other

## 2019-05-13 ENCOUNTER — Ambulatory Visit: Payer: Commercial Indemnity | Admitting: Podiatry

## 2019-05-13 ENCOUNTER — Ambulatory Visit: Payer: Medicare Other | Admitting: Cardiology

## 2019-05-29 ENCOUNTER — Ambulatory Visit: Payer: Medicare Other | Admitting: Cardiology

## 2019-06-25 ENCOUNTER — Telehealth: Payer: Self-pay | Admitting: Podiatry

## 2019-06-25 NOTE — Telephone Encounter (Signed)
Late entry from 11.23.2020  Pt called upset because she got a bill for 398.00 for the orthotics and she is not paying it. She stated she called insurance company and they said they are covered and we did not bill correctly. I told pt I would look into it and see if we could get additional dx codes and resend  it to [employer].

## 2019-08-12 ENCOUNTER — Ambulatory Visit (INDEPENDENT_AMBULATORY_CARE_PROVIDER_SITE_OTHER): Payer: Medicare Other | Admitting: Podiatry

## 2019-08-12 ENCOUNTER — Encounter: Payer: Self-pay | Admitting: Podiatry

## 2019-08-12 ENCOUNTER — Other Ambulatory Visit: Payer: Self-pay

## 2019-08-12 DIAGNOSIS — M79675 Pain in left toe(s): Secondary | ICD-10-CM

## 2019-08-12 DIAGNOSIS — R262 Difficulty in walking, not elsewhere classified: Secondary | ICD-10-CM | POA: Diagnosis not present

## 2019-08-12 DIAGNOSIS — M79674 Pain in right toe(s): Secondary | ICD-10-CM | POA: Diagnosis not present

## 2019-08-12 DIAGNOSIS — M79671 Pain in right foot: Secondary | ICD-10-CM | POA: Diagnosis not present

## 2019-08-12 DIAGNOSIS — Q828 Other specified congenital malformations of skin: Secondary | ICD-10-CM

## 2019-08-12 DIAGNOSIS — B351 Tinea unguium: Secondary | ICD-10-CM | POA: Diagnosis not present

## 2019-08-12 DIAGNOSIS — M79672 Pain in left foot: Secondary | ICD-10-CM

## 2019-08-12 NOTE — Progress Notes (Signed)
Subjective: Mindy King presents to clinic with cc of painful mycotic toenails and multiple painful porokeratoses b/l feet which are aggravated when weightbearing with and without shoe gear.  This pain limits her ambulation, and therefore, her daily activities. Pain symptoms resolve with periodic professional debridement.  She did pick up her orthotics in August. States they make her feet feel worse and she hasn't worn them.  She states she did speak to her insurance company and they have approved six visits for her.   She would also like to discuss surgical correction that could possibly alleviate any of her pain.  Berkley Harvey, NP is her PCP.  Medications reviewed in chart.  Allergies  Allergen Reactions  . Ampicillin      Objective: There were no vitals filed for this visit.  Physical Examination:  Vascular  Examination: Capillary refill time to digits <3 seconds b/l.  Palpable pedal pulses b/l.   Digital hair remains sparse b/l.   No edema noted b/l.  Skin temperature gradient WNL b/l.  Dermatological Examination: Skin with normal turgor, texture and tone b/l.  No open wounds b/l.  No interdigital macerations noted b/l.  Elongated, thick, discolored brittle toenails with subungual debris and pain on dorsal palpation of nailbeds 1-5 b/l.  Porokeratotic lesions submet head 4 right foot, subhallux b/l, submet head 1 right foot, medial aspect left 2nd digit, medial right 2nd digit, all with tenderness to palpation. No erythema, no edema, no drainage, no flocculence.   Musculoskeletal Examination: Muscle strength 5/5 to all muscle groups b/l.  Hammertoe deformity lesser digits b/l.  HAV with bunion deformity b/l.  No pain, crepitus or joint discomfort with active/passive ROM.  Neurological Examination: Sensation intact 5/5 b/l with 10 gram monofilament.  Vibratory sensation intact b/l.  Proprioceptive sensation intact b/l.  Assessment: 1. Mycotic nail  infection with pain 1-5 b/l 2. Multiple porokeratotic lesions submet head 4 right foot, subhallux b/l, submet head 1 right foot, medial aspect left 2nd digit, medial right 2nd digit  Plan: 1. Toenails 1-5 b/l were debrided in length and girth without iatrogenic laceration. Porokeratosis submet head 4 right foot, subhallux b/l, submet head 1 right foot, medial aspect left 2nd digit, medial right 2nd digit pared and enucleated with sterile scalpel blade without incident. Will refer to Dr. Cannon Kettle for surgical consultation regarding painful lesions foot deformities. I have asked her to bring her Orthotics back for modifications.  Continue soft, supportive shoe gear daily. Follow up 6 weeks. Report any pedal injuries to medical professional. Patient/POA to call should there be a question/concern in there interim.

## 2019-08-12 NOTE — Patient Instructions (Signed)

## 2019-08-27 ENCOUNTER — Ambulatory Visit: Payer: Medicare Other | Admitting: Sports Medicine

## 2019-08-27 ENCOUNTER — Other Ambulatory Visit: Payer: Medicare Other | Admitting: Orthotics

## 2019-09-23 ENCOUNTER — Ambulatory Visit (INDEPENDENT_AMBULATORY_CARE_PROVIDER_SITE_OTHER): Payer: Medicare Other | Admitting: Podiatry

## 2019-09-23 ENCOUNTER — Other Ambulatory Visit: Payer: Self-pay

## 2019-09-23 ENCOUNTER — Encounter: Payer: Self-pay | Admitting: Podiatry

## 2019-09-23 DIAGNOSIS — Q828 Other specified congenital malformations of skin: Secondary | ICD-10-CM

## 2019-09-23 DIAGNOSIS — M79671 Pain in right foot: Secondary | ICD-10-CM

## 2019-09-23 DIAGNOSIS — M79672 Pain in left foot: Secondary | ICD-10-CM

## 2019-09-23 NOTE — Patient Instructions (Signed)

## 2019-09-26 NOTE — Progress Notes (Signed)
Subjective: Mindy King presents today for follow up of follow up painful corns and porokeratoses b/l feet. She has problems ambulating due to plantar calluses, interdigital corns and b/l foot deformities. Her insurance company has approved her for a few visits, after which, she is to seek an in-network provider. She states her desire is to stay with the practice and she will speak to insurance company again regarding the issue..   Allergies  Allergen Reactions  . Ampicillin   . Mometasone Furo-Formoterol Fum Diarrhea    Abdominal cramps     Objective: There were no vitals filed for this visit.  Vascular Examination:  Capillary fill time to digits <3s b/l, palpable DP pulses b/l, palpable PT pulses b/l, pedal hair sparse b/l and skin temperature gradient within normal limits b/l  Dermatological Examination: Pedal skin with normal turgor, texture and tone bilaterally, no open wounds bilaterally, no interdigital macerations bilaterally and porokeratotic lesion(s) with pain on palpation submet head 4 right, subhallux b/l, submet head 1 right foot, medial aspect 2nd digit b/l. No erythema, no edema, no drainage, no flocculence.  Mycotic toenails noted to have adquate length on today's visit.  Musculoskeletal: Normal muscle strength 5/5 to all lower extremity muscle groups bilaterally, no pain crepitus or joint limitation noted with ROM b/l, bunion deformity noted b/l and hammertoes noted to the  2-5 bilaterally  Neurological: Protective sensation intact 5/5 intact bilaterally with 10g monofilament b/l and vibratory sensation intact b/l  Assessment: 1. Porokeratosis   2. Pain in both feet    Plan: -Painful porokeratotic lesions submet head 4 right, subhallux b/l, submet head 1 right foot, medial aspect 2nd digit pared and enucleated with sterile scalpel blade without iatrogenic bleeding. -Patient to continue soft, supportive shoe gear daily. -Patient to report any pedal injuries to  medical professional immediately. -Patient/POA to call should there be question/concern in the interim.  Return in about 6 weeks (around 11/04/2019) for nail and callus trim.

## 2019-11-04 ENCOUNTER — Ambulatory Visit (INDEPENDENT_AMBULATORY_CARE_PROVIDER_SITE_OTHER): Payer: Medicare Other | Admitting: Podiatry

## 2019-11-04 ENCOUNTER — Encounter: Payer: Self-pay | Admitting: Podiatry

## 2019-11-04 ENCOUNTER — Other Ambulatory Visit: Payer: Self-pay

## 2019-11-04 VITALS — Temp 97.7°F

## 2019-11-04 DIAGNOSIS — M79672 Pain in left foot: Secondary | ICD-10-CM

## 2019-11-04 DIAGNOSIS — Q828 Other specified congenital malformations of skin: Secondary | ICD-10-CM

## 2019-11-04 DIAGNOSIS — M79671 Pain in right foot: Secondary | ICD-10-CM

## 2019-11-04 DIAGNOSIS — R262 Difficulty in walking, not elsewhere classified: Secondary | ICD-10-CM

## 2019-11-04 NOTE — Progress Notes (Signed)
Subjective: Mindy King presents today for follow up of painful porokeratotic lesion(s) b/l feet that limit ambulation. Aggravating factors include weightbearing with and without shoe gear. Pain for both is relieved with periodic professional debridement. She relates she has had to trim her left great toe lesion because it was painful.   Allergies  Allergen Reactions  . Ampicillin   . Mometasone Furo-Formoterol Fum Diarrhea    Abdominal cramps     Objective: Vitals:   11/04/19 0803  Temp: 97.7 F (36.5 C)    Pt 69 y.o. year old AA female, thin build in NAD. AAO x 3.   Vascular Examination:  Capillary fill time to digits <3 seconds b/l. Palpable DP pulses b/l. Palpable PT pulses b/l. Pedal hair sparse b/l. Skin temperature gradient within normal limits b/l.  Dermatological Examination: Pedal skin with normal turgor, texture and tone bilaterally. No open wounds bilaterally. No interdigital macerations bilaterally. Toenails 1-5 adequate length. . Porokeratotic lesion(s) L hallux, L 2nd toe, R hallux, R 2nd toe, submet head 1 right foot, submet head 4 right foot, submet head 5 left foot and submet head 5 right foot. No erythema, no edema, no drainage, no flocculence.  Musculoskeletal: Normal muscle strength 5/5 to all lower extremity muscle groups bilaterally, no pain crepitus or joint limitation noted with ROM b/l, bunion deformity noted b/l and hammertoes noted to the  2-5 bilaterally.  Neurological: Protective sensation intact 5/5 intact bilaterally with 10g monofilament b/l Vibratory sensation intact b/l  Assessment: 1. Porokeratosis   2. Pain in both feet   3. Difficulty walking    Plan: -Painful porokeratotic lesion(s) L hallux, L 2nd toe, R hallux, R 2nd toe, submet head 1 right foot, submet head 4 right foot, submet head 5 left foot and submet head 5 right foot pared and enucleated with sterile scalpel blade without incident. -Patient to continue soft, supportive shoe gear  daily. -Patient to report any pedal injuries to medical professional immediately. -Patient/POA to call should there be question/concern in the interim.  Return in about 6 weeks (around 12/16/2019) for nail and callus trim.

## 2019-11-04 NOTE — Patient Instructions (Signed)

## 2020-01-30 DIAGNOSIS — Z85118 Personal history of other malignant neoplasm of bronchus and lung: Secondary | ICD-10-CM | POA: Insufficient documentation

## 2020-02-04 ENCOUNTER — Other Ambulatory Visit: Payer: Self-pay | Admitting: Otolaryngology

## 2020-02-04 DIAGNOSIS — H6123 Impacted cerumen, bilateral: Secondary | ICD-10-CM

## 2020-02-04 DIAGNOSIS — Z85118 Personal history of other malignant neoplasm of bronchus and lung: Secondary | ICD-10-CM

## 2020-02-04 DIAGNOSIS — R1314 Dysphagia, pharyngoesophageal phase: Secondary | ICD-10-CM

## 2020-02-10 ENCOUNTER — Ambulatory Visit
Admission: RE | Admit: 2020-02-10 | Discharge: 2020-02-10 | Disposition: A | Discharge status: Home / Self Care | Payer: Medicare Other | Source: Ambulatory Visit | Attending: Otolaryngology | Admitting: Otolaryngology

## 2020-02-10 DIAGNOSIS — R1314 Dysphagia, pharyngoesophageal phase: Secondary | ICD-10-CM

## 2020-02-10 DIAGNOSIS — H6123 Impacted cerumen, bilateral: Secondary | ICD-10-CM

## 2020-02-10 DIAGNOSIS — Z85118 Personal history of other malignant neoplasm of bronchus and lung: Secondary | ICD-10-CM

## 2020-07-15 ENCOUNTER — Encounter (HOSPITAL_COMMUNITY): Payer: Self-pay

## 2020-07-15 ENCOUNTER — Emergency Department (HOSPITAL_COMMUNITY)
Admission: EM | Admit: 2020-07-15 | Discharge: 2020-07-15 | Disposition: A | Discharge status: Home / Self Care | Payer: Medicare Other | Attending: Emergency Medicine | Admitting: Emergency Medicine

## 2020-07-15 ENCOUNTER — Other Ambulatory Visit: Payer: Self-pay

## 2020-07-15 DIAGNOSIS — R509 Fever, unspecified: Secondary | ICD-10-CM | POA: Diagnosis present

## 2020-07-15 DIAGNOSIS — R059 Cough, unspecified: Secondary | ICD-10-CM | POA: Diagnosis not present

## 2020-07-15 DIAGNOSIS — M791 Myalgia, unspecified site: Secondary | ICD-10-CM | POA: Insufficient documentation

## 2020-07-15 DIAGNOSIS — Z5321 Procedure and treatment not carried out due to patient leaving prior to being seen by health care provider: Secondary | ICD-10-CM | POA: Insufficient documentation

## 2020-07-15 DIAGNOSIS — Z859 Personal history of malignant neoplasm, unspecified: Secondary | ICD-10-CM | POA: Insufficient documentation

## 2020-07-15 DIAGNOSIS — R630 Anorexia: Secondary | ICD-10-CM | POA: Insufficient documentation

## 2020-07-15 MED ORDER — ACETAMINOPHEN 325 MG PO TABS
650.0000 mg | ORAL_TABLET | Freq: Once | ORAL | Status: DC | PRN
  Filled 2020-07-15: qty 2

## 2020-07-15 NOTE — ED Triage Notes (Addendum)
Pt presents with c/o generalized body aches and fever at home. Pt also c/o cough. Pt is not vaccinated. Pt also reports decreased appetite. 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 Pt reports symptoms have been present for 2 days. Pt reports a fever at home but says she does not have an actual number.

## 2020-07-17 ENCOUNTER — Other Ambulatory Visit: Payer: Self-pay

## 2020-07-17 ENCOUNTER — Encounter (HOSPITAL_COMMUNITY): Payer: Self-pay

## 2020-07-17 ENCOUNTER — Emergency Department (HOSPITAL_COMMUNITY): Payer: Medicare Other

## 2020-07-17 ENCOUNTER — Observation Stay (HOSPITAL_COMMUNITY)
Admission: EM | Admit: 2020-07-17 | Discharge: 2020-07-18 | Disposition: A | Discharge status: Home / Self Care | Payer: Medicare Other | Attending: Internal Medicine | Admitting: Internal Medicine

## 2020-07-17 DIAGNOSIS — Z23 Encounter for immunization: Secondary | ICD-10-CM | POA: Diagnosis not present

## 2020-07-17 DIAGNOSIS — C3432 Malignant neoplasm of lower lobe, left bronchus or lung: Secondary | ICD-10-CM | POA: Diagnosis not present

## 2020-07-17 DIAGNOSIS — E860 Dehydration: Secondary | ICD-10-CM

## 2020-07-17 DIAGNOSIS — Z85118 Personal history of other malignant neoplasm of bronchus and lung: Secondary | ICD-10-CM | POA: Diagnosis not present

## 2020-07-17 DIAGNOSIS — U071 COVID-19: Secondary | ICD-10-CM | POA: Diagnosis not present

## 2020-07-17 DIAGNOSIS — E782 Mixed hyperlipidemia: Secondary | ICD-10-CM | POA: Diagnosis present

## 2020-07-17 DIAGNOSIS — Z7982 Long term (current) use of aspirin: Secondary | ICD-10-CM | POA: Insufficient documentation

## 2020-07-17 DIAGNOSIS — R059 Cough, unspecified: Secondary | ICD-10-CM | POA: Diagnosis present

## 2020-07-17 DIAGNOSIS — J439 Emphysema, unspecified: Secondary | ICD-10-CM | POA: Diagnosis present

## 2020-07-17 DIAGNOSIS — J1282 Pneumonia due to coronavirus disease 2019: Secondary | ICD-10-CM | POA: Diagnosis not present

## 2020-07-17 DIAGNOSIS — F17200 Nicotine dependence, unspecified, uncomplicated: Secondary | ICD-10-CM | POA: Diagnosis not present

## 2020-07-17 LAB — CBC WITH DIFFERENTIAL/PLATELET
Abs Immature Granulocytes: 0.01 10*3/uL (ref 0.00–0.07)
Basophils Absolute: 0 10*3/uL (ref 0.0–0.1)
Basophils Relative: 0 %
Eosinophils Absolute: 0 10*3/uL (ref 0.0–0.5)
Eosinophils Relative: 0 %
HCT: 43.2 % (ref 36.0–46.0)
Hemoglobin: 14.3 g/dL (ref 12.0–15.0)
Immature Granulocytes: 0 %
Lymphocytes Relative: 24 %
Lymphs Abs: 1.2 10*3/uL (ref 0.7–4.0)
MCH: 30 pg (ref 26.0–34.0)
MCHC: 33.1 g/dL (ref 30.0–36.0)
MCV: 90.6 fL (ref 80.0–100.0)
Monocytes Absolute: 0.3 10*3/uL (ref 0.1–1.0)
Monocytes Relative: 6 %
Neutro Abs: 3.6 10*3/uL (ref 1.7–7.7)
Neutrophils Relative %: 70 %
Platelets: 186 10*3/uL (ref 150–400)
RBC: 4.77 MIL/uL (ref 3.87–5.11)
RDW: 14.5 % (ref 11.5–15.5)
WBC: 5.2 10*3/uL (ref 4.0–10.5)
nRBC: 0 % (ref 0.0–0.2)

## 2020-07-17 LAB — COMPREHENSIVE METABOLIC PANEL
ALT: 35 U/L (ref 0–44)
AST: 46 U/L — ABNORMAL HIGH (ref 15–41)
Albumin: 3.5 g/dL (ref 3.5–5.0)
Alkaline Phosphatase: 57 U/L (ref 38–126)
Anion gap: 9 (ref 5–15)
BUN: 9 mg/dL (ref 8–23)
CO2: 25 mmol/L (ref 22–32)
Calcium: 8.3 mg/dL — ABNORMAL LOW (ref 8.9–10.3)
Chloride: 104 mmol/L (ref 98–111)
Creatinine, Ser: 0.85 mg/dL (ref 0.44–1.00)
GFR, Estimated: >60 mL/min (ref >60)
Glucose, Bld: 94 mg/dL (ref 70–99)
Potassium: 4.2 mmol/L (ref 3.5–5.1)
Sodium: 138 mmol/L (ref 135–145)
Total Bilirubin: 0.6 mg/dL (ref 0.3–1.2)
Total Protein: 6.7 g/dL (ref 6.5–8.1)

## 2020-07-17 LAB — URINALYSIS, ROUTINE W REFLEX MICROSCOPIC
Bilirubin Urine: NEGATIVE
Glucose, UA: NEGATIVE mg/dL
Ketones, ur: 5 mg/dL — AB
Leukocytes,Ua: NEGATIVE
Nitrite: NEGATIVE
Protein, ur: NEGATIVE mg/dL
Specific Gravity, Urine: 1.016 (ref 1.005–1.030)
pH: 5 (ref 5.0–8.0)

## 2020-07-17 LAB — CBC
HCT: 41.5 % (ref 36.0–46.0)
Hemoglobin: 13.5 g/dL (ref 12.0–15.0)
MCH: 29.5 pg (ref 26.0–34.0)
MCHC: 32.5 g/dL (ref 30.0–36.0)
MCV: 90.8 fL (ref 80.0–100.0)
Platelets: 161 10*3/uL (ref 150–400)
RBC: 4.57 MIL/uL (ref 3.87–5.11)
RDW: 14.8 % (ref 11.5–15.5)
WBC: 5.3 10*3/uL (ref 4.0–10.5)
nRBC: 0 % (ref 0.0–0.2)

## 2020-07-17 LAB — FIBRINOGEN: Fibrinogen: 407 mg/dL (ref 210–475)

## 2020-07-17 LAB — LACTATE DEHYDROGENASE: LDH: 285 U/L — ABNORMAL HIGH (ref 98–192)

## 2020-07-17 LAB — TRIGLYCERIDES: Triglycerides: 66 mg/dL (ref <150)

## 2020-07-17 LAB — RESP PANEL BY RT-PCR (FLU A&B, COVID) ARPGX2
Influenza A by PCR: NEGATIVE
Influenza B by PCR: NEGATIVE
SARS Coronavirus 2 by RT PCR: POSITIVE — AB

## 2020-07-17 LAB — FERRITIN: Ferritin: 114 ng/mL (ref 11–307)

## 2020-07-17 LAB — LACTIC ACID, PLASMA
Lactic Acid, Venous: 1.2 mmol/L (ref 0.5–1.9)
Lactic Acid, Venous: 1.8 mmol/L (ref 0.5–1.9)

## 2020-07-17 LAB — D-DIMER, QUANTITATIVE: D-Dimer, Quant: 0.54 ug/mL-FEU — ABNORMAL HIGH (ref 0.00–0.50)

## 2020-07-17 LAB — CREATININE, SERUM
Creatinine, Ser: 0.56 mg/dL (ref 0.44–1.00)
GFR, Estimated: >60 mL/min (ref >60)

## 2020-07-17 LAB — C-REACTIVE PROTEIN: CRP: 0.6 mg/dL (ref <1.0)

## 2020-07-17 LAB — PROCALCITONIN: Procalcitonin: <0.1 ng/mL

## 2020-07-17 LAB — HIV ANTIBODY (ROUTINE TESTING W REFLEX): HIV Screen 4th Generation wRfx: NONREACTIVE

## 2020-07-17 MED ORDER — METHYLPREDNISOLONE SODIUM SUCC 125 MG IJ SOLR: 125.0000 mg | Freq: Once | INTRAMUSCULAR | Status: DC | PRN

## 2020-07-17 MED ORDER — MONTELUKAST SODIUM 10 MG PO TABS
10.0000 mg | ORAL_TABLET | Freq: Every day | ORAL | Status: DC
  Administered 2020-07-17: 10 mg via ORAL
  Filled 2020-07-17 (×2): qty 1

## 2020-07-17 MED ORDER — TIOTROPIUM BROMIDE MONOHYDRATE 18 MCG IN CAPS: 1.0000 | ORAL_CAPSULE | Freq: Every day | RESPIRATORY_TRACT | Status: DC

## 2020-07-17 MED ORDER — SODIUM CHLORIDE 0.9 % IV SOLN
Freq: Once | INTRAVENOUS | Status: AC
  Filled 2020-07-17: qty 20

## 2020-07-17 MED ORDER — ALBUTEROL SULFATE HFA 108 (90 BASE) MCG/ACT IN AERS
1.0000 | INHALATION_SPRAY | RESPIRATORY_TRACT | Status: DC | PRN
Start: 2020-07-17 — End: 2020-07-18

## 2020-07-17 MED ORDER — UMECLIDINIUM BROMIDE 62.5 MCG/INH IN AEPB
1.0000 | INHALATION_SPRAY | Freq: Every day | RESPIRATORY_TRACT | Status: DC
  Administered 2020-07-18: 1 via RESPIRATORY_TRACT
  Filled 2020-07-17: qty 7

## 2020-07-17 MED ORDER — FAMOTIDINE IN NACL 20-0.9 MG/50ML-% IV SOLN: 20.0000 mg | Freq: Once | INTRAVENOUS | Status: DC | PRN

## 2020-07-17 MED ORDER — UMECLIDINIUM BROMIDE 62.5 MCG/INH IN AEPB
1.0000 | INHALATION_SPRAY | Freq: Every day | RESPIRATORY_TRACT | Status: DC
  Filled 2020-07-17: qty 7

## 2020-07-17 MED ORDER — ENOXAPARIN SODIUM 40 MG/0.4ML ~~LOC~~ SOLN
40.0000 mg | SUBCUTANEOUS | Status: DC
  Administered 2020-07-17: 40 mg via SUBCUTANEOUS
  Filled 2020-07-17: qty 0.4

## 2020-07-17 MED ORDER — SODIUM CHLORIDE 0.9 % IV BOLUS
1000.0000 mL | Freq: Once | INTRAVENOUS | Status: AC
  Administered 2020-07-17: 1000 mL via INTRAVENOUS

## 2020-07-17 MED ORDER — ASCORBIC ACID 500 MG PO TABS
500.0000 mg | ORAL_TABLET | Freq: Every day | ORAL | Status: DC
  Administered 2020-07-17 – 2020-07-18 (×2): 500 mg via ORAL
  Filled 2020-07-17 (×2): qty 1

## 2020-07-17 MED ORDER — SODIUM CHLORIDE 0.9 % IV SOLN: INTRAVENOUS | Status: DC

## 2020-07-17 MED ORDER — SODIUM CHLORIDE 0.9 % IV SOLN: INTRAVENOUS | Status: DC | PRN

## 2020-07-17 MED ORDER — ONDANSETRON HCL 4 MG/2ML IJ SOLN
4.0000 mg | Freq: Four times a day (QID) | INTRAMUSCULAR | Status: DC | PRN
  Administered 2020-07-17: 4 mg via INTRAVENOUS
  Filled 2020-07-17: qty 2

## 2020-07-17 MED ORDER — ATORVASTATIN CALCIUM 20 MG PO TABS
20.0000 mg | ORAL_TABLET | Freq: Every day | ORAL | Status: DC
  Filled 2020-07-17: qty 2

## 2020-07-17 MED ORDER — ZINC SULFATE 220 (50 ZN) MG PO CAPS
220.0000 mg | ORAL_CAPSULE | Freq: Every day | ORAL | Status: DC
  Administered 2020-07-17 – 2020-07-18 (×2): 220 mg via ORAL
  Filled 2020-07-17 (×2): qty 1

## 2020-07-17 MED ORDER — SODIUM CHLORIDE 0.9 % IV SOLN
1000.0000 mL | INTRAVENOUS | Status: DC
  Administered 2020-07-17: 1000 mL via INTRAVENOUS

## 2020-07-17 MED ORDER — MORPHINE SULFATE (PF) 4 MG/ML IV SOLN
4.0000 mg | Freq: Once | INTRAVENOUS | Status: AC
  Administered 2020-07-17: 4 mg via INTRAVENOUS
  Filled 2020-07-17: qty 1

## 2020-07-17 MED ORDER — ALBUTEROL SULFATE HFA 108 (90 BASE) MCG/ACT IN AERS: 2.0000 | INHALATION_SPRAY | Freq: Once | RESPIRATORY_TRACT | Status: DC | PRN

## 2020-07-17 MED ORDER — EPINEPHRINE 0.3 MG/0.3ML IJ SOAJ
0.3000 mg | Freq: Once | INTRAMUSCULAR | Status: DC | PRN
  Filled 2020-07-17 (×2): qty 0.6

## 2020-07-17 MED ORDER — ACETAMINOPHEN 325 MG PO TABS
650.0000 mg | ORAL_TABLET | Freq: Four times a day (QID) | ORAL | Status: DC | PRN
  Filled 2020-07-17: qty 2

## 2020-07-17 MED ORDER — DIPHENHYDRAMINE HCL 50 MG/ML IJ SOLN: 50.0000 mg | Freq: Once | INTRAMUSCULAR | Status: DC | PRN

## 2020-07-17 NOTE — ED Notes (Addendum)
ED TO INPATIENT HANDOFF REPORT  ED Nurse Name and Phone #: Fredonia Highland [phone]  S Name/Age/Gender Mindy King 69 y.o. female Room/Bed: WA14/WA14  Code Status   Code Status: Full Code  Home/SNF/Other Home Patient oriented to: self and place Is this baseline? Yes   Triage Complete: Triage complete  Chief Complaint Pneumonia due to COVID-19 virus [U07.1, J12.82]  Triage Note Pt bib ems from home following n/v, fever, cough, and fatigue x2 days. Hx lung cancer, in remission. A/Ox4, ambulatory. Pt NOT vaccinated against covid-19.  EMS vitals: 98% RA 138/80 HR 100 RR 22 101.59f     Allergies Allergies  Allergen Reactions  . Ampicillin   . Mometasone Furo-Formoterol Fum Diarrhea    Abdominal cramps    Level of Care/Admitting Diagnosis ED Disposition    ED Disposition Condition Comment   Admit  Hospital Area: [facility] [[id number]]  Level of Care: Telemetry [5]  Admit to tele based on following criteria: Complex arrhythmia (Bradycardia/Tachycardia)  Covid Evaluation: Confirmed COVID Positive  Diagnosis: Pneumonia due to COVID-19 virus [[id number]]  Admitting Physician: CHIU, [place]  Attending Physician: Donne Hazel [[id number]]       B Medical/Surgery History Past Medical History:  Diagnosis Date  . Corns and callosities 10/01/2018  . Hyperlipidemia   . Lung cancer ([place])   . Onychomycosis 10/01/2018   Past Surgical History:  Procedure Laterality Date  . LUNG BIOPSY  2012  . LUNG CANCER SURGERY  2012     A IV Location/Drains/Wounds Patient Lines/Drains/Airways Status    Active Line/Drains/Airways    Name Placement date Placement time Site Days   Peripheral IV 07/17/20 Anterior;Right Forearm 07/17/20  1221  Forearm  less than 1   Peripheral IV 07/17/20 Left Antecubital 07/17/20  1219  Antecubital  less than 1          Intake/Output Last 24 hours No intake or output data in the 24 hours ending 07/17/20  2154  Labs/Imaging Results for orders placed or performed during the hospital encounter of 07/17/20 (from the past 48 hour(s))  Resp Panel by RT-PCR (Flu A&B, Covid) Nasopharyngeal Swab     Status: Abnormal   Collection Time: 07/17/20 12:24 PM   Specimen: Nasopharyngeal Swab; Nasopharyngeal(NP) swabs in vial transport medium  Result Value Ref Range   SARS Coronavirus 2 by RT PCR POSITIVE (A) NEGATIVE    Comment: RESULT CALLED TO, READ BACK BY AND VERIFIED WITH: S.CLAPP RN AT 1420 ON 07/17/20 BY S.VANHOORNE (NOTE) SARS-CoV-2 target nucleic acids are DETECTED.  The SARS-CoV-2 RNA is generally detectable in upper respiratory specimens during the acute phase of infection. Positive results are indicative of the presence of the identified virus, but do not rule out bacterial infection or co-infection with other pathogens not detected by the test. Clinical correlation with patient history and other diagnostic information is necessary to determine patient infection status. The expected result is Negative.  Fact Sheet for Patients: EntrepreneurPulse.com.au  Fact Sheet for Healthcare Providers: IncredibleEmployment.be  This test is not yet approved or cleared by the [place] FDA and  has been authorized for detection and/or diagnosis of SARS-CoV-2 by FDA under an Emergency Use Authorization (EUA).  This EUA will remain in effect (meaning this  test can be used) for the duration of  the COVID-19 declaration under Section 564(b)(1) of the Act, 21 U.S.C. section 360bbb-3(b)(1), unless the authorization is terminated or revoked sooner.     Influenza A by PCR NEGATIVE NEGATIVE   Influenza  B by PCR NEGATIVE NEGATIVE    Comment: (NOTE) The Xpert Xpress SARS-CoV-2/FLU/RSV plus assay is intended as an aid in the diagnosis of influenza from Nasopharyngeal swab specimens and should not be used as a sole basis for treatment. Nasal washings and aspirates  are unacceptable for Xpert Xpress SARS-CoV-2/FLU/RSV testing.  Fact Sheet for Patients: EntrepreneurPulse.com.au  Fact Sheet for Healthcare Providers: IncredibleEmployment.be  This test is not yet approved or cleared by the [place] FDA and has been authorized for detection and/or diagnosis of SARS-CoV-2 by FDA under an Emergency Use Authorization (EUA). This EUA will remain in effect (meaning this test can be used) for the duration of the COVID-19 declaration under Section 564(b)(1) of the Act, 21 U.S.C. section 360bbb-3(b)(1), unless the authorization is terminated or revoked.  Performed at [facility], [place] [address] [place], [place] [address]   Lactic acid, plasma     Status: None   Collection Time: 07/17/20 12:24 PM  Result Value Ref Range   Lactic Acid, Venous 1.2 0.5 - 1.9 mmol/L    Comment: Performed at [facility], [place] [address] [place], [place] [address]  CBC WITH DIFFERENTIAL     Status: None   Collection Time: 07/17/20 12:24 PM  Result Value Ref Range   WBC 5.2 4.0 - 10.5 K/uL   RBC 4.77 3.87 - 5.11 MIL/uL   Hemoglobin 14.3 12.0 - 15.0 g/dL   HCT 43.2 36.0 - 46.0 %   MCV 90.6 80.0 - 100.0 fL   MCH 30.0 26.0 - 34.0 pg   MCHC 33.1 30.0 - 36.0 g/dL   RDW 14.5 11.5 - 15.5 %   Platelets 186 150 - 400 K/uL   nRBC 0.0 0.0 - 0.2 %   Neutrophils Relative % 70 %   Neutro Abs 3.6 1.7 - 7.7 K/uL   Lymphocytes Relative 24 %   Lymphs Abs 1.2 0.7 - 4.0 K/uL   Monocytes Relative 6 %   Monocytes Absolute 0.3 0.1 - 1.0 K/uL   Eosinophils Relative 0 %   Eosinophils Absolute 0.0 0.0 - 0.5 K/uL   Basophils Relative 0 %   Basophils Absolute 0.0 0.0 - 0.1 K/uL   Immature Granulocytes 0 %   Abs Immature Granulocytes 0.01 0.00 - 0.07 K/uL    Comment: Performed at [facility], [place] [address], [place],  [address]  Comprehensive metabolic panel     Status: Abnormal    Collection Time: 07/17/20 12:24 PM  Result Value Ref Range   Sodium 138 135 - 145 mmol/L   Potassium 4.2 3.5 - 5.1 mmol/L   Chloride 104 98 - 111 mmol/L   CO2 25 22 - 32 mmol/L   Glucose, Bld 94 70 - 99 mg/dL    Comment: Glucose reference range applies only to samples taken after fasting for at least 8 hours.   BUN 9 8 - 23 mg/dL   Creatinine, Ser 0.85 0.44 - 1.00 mg/dL   Calcium 8.3 (L) 8.9 - 10.3 mg/dL   Total Protein 6.7 6.5 - 8.1 g/dL   Albumin 3.5 3.5 - 5.0 g/dL   AST 46 (H) 15 - 41 U/L   ALT 35 0 - 44 U/L   Alkaline Phosphatase 57 38 - 126 U/L   Total Bilirubin 0.6 0.3 - 1.2 mg/dL   GFR, Estimated >60 >60 mL/min    Comment: (NOTE) Calculated using the CKD-EPI Creatinine Equation (2021)    Anion gap 9 5 - 15  Comment: Performed at [facility], [place] [address] [place], [place] [address]  D-dimer, quantitative     Status: Abnormal   Collection Time: 07/17/20 12:24 PM  Result Value Ref Range   D-Dimer, Quant 0.54 (H) 0.00 - 0.50 ug/mL-FEU    Comment: (NOTE) At the manufacturer cut-off value of 0.5 g/mL FEU, this assay has a negative predictive value of 95-100%.This assay is intended for use in conjunction with a clinical pretest probability (PTP) assessment model to exclude pulmonary embolism (PE) and deep venous thrombosis (DVT) in outpatients suspected of PE or DVT. Results should be correlated with clinical presentation. Performed at [facility], [place] [address], [place], [place] [address]   Procalcitonin     Status: None   Collection Time: 07/17/20 12:24 PM  Result Value Ref Range   Procalcitonin <0.10 ng/mL    Comment:        Interpretation: PCT (Procalcitonin) <= 0.5 ng/mL: Systemic infection (sepsis) is not likely. Local bacterial infection is possible. (NOTE)       Sepsis PCT Algorithm           Lower Respiratory Tract                                      Infection PCT Algorithm    ----------------------------      ----------------------------         PCT < 0.25 ng/mL                PCT < 0.10 ng/mL          Strongly encourage             Strongly discourage   discontinuation of antibiotics    initiation of antibiotics    ----------------------------     -----------------------------       PCT 0.25 - 0.50 ng/mL            PCT 0.10 - 0.25 ng/mL               OR       >80% decrease in PCT            Discourage initiation of                                            antibiotics      Encourage discontinuation           of antibiotics    ----------------------------     -----------------------------         PCT >= 0.50 ng/mL              PCT 0.26 - 0.50 ng/mL               AND        <80% decrease in PCT             Encourage initiation of                                             antibiotics       Encourage continuation           of antibiotics    ----------------------------     -----------------------------  PCT >= 0.50 ng/mL                  PCT > 0.50 ng/mL               AND         increase in PCT                  Strongly encourage                                      initiation of antibiotics    Strongly encourage escalation           of antibiotics                                     -----------------------------                                           PCT <= 0.25 ng/mL                                                 OR                                        > 80% decrease in PCT                                      Discontinue / Do not initiate                                             antibiotics  Performed at [place] [address], [place], [place] [address]   Lactate dehydrogenase     Status: Abnormal   Collection Time: 07/17/20 12:24 PM  Result Value Ref Range   LDH 285 (H) 98 - 192 U/L    Comment: Performed at [facility], [place] [address], [place], [place] [address]  Ferritin     Status: None   Collection Time:  07/17/20 12:24 PM  Result Value Ref Range   Ferritin 114 11 - 307 ng/mL    Comment: Performed at [facility], [place] [address] [place], [place] [address]  Triglycerides     Status: None   Collection Time: 07/17/20 12:24 PM  Result Value Ref Range   Triglycerides 66 <150 mg/dL    Comment: Performed at [facility], [place] [address] [place], [place] [address]  Fibrinogen     Status: None   Collection Time: 07/17/20 12:24 PM  Result Value Ref Range   Fibrinogen 407 210 - 475 mg/dL    Comment: Performed at [facility], [place] [address] [place], [place] [address]  C-reactive protein     Status: None   Collection Time:  07/17/20 12:24 PM  Result Value Ref Range   CRP 0.6 <1.0 mg/dL    Comment: Performed at [facility], [place] [address], [place], [place] [address]  Lactic acid, plasma     Status: None   Collection Time: 07/17/20  5:11 PM  Result Value Ref Range   Lactic Acid, Venous 1.8 0.5 - 1.9 mmol/L    Comment: Performed at [facility], [place] [address], [place], [place] [address]  Urinalysis, Routine w reflex microscopic Urine, Clean Catch     Status: Abnormal   Collection Time: 07/17/20  5:11 PM  Result Value Ref Range   Color, Urine YELLOW YELLOW   APPearance HAZY (A) CLEAR   Specific Gravity, Urine 1.016 1.005 - 1.030   pH 5.0 5.0 - 8.0   Glucose, UA NEGATIVE NEGATIVE mg/dL   Hgb urine dipstick SMALL (A) NEGATIVE   Bilirubin Urine NEGATIVE NEGATIVE   Ketones, ur 5 (A) NEGATIVE mg/dL   Protein, ur NEGATIVE NEGATIVE mg/dL   Nitrite NEGATIVE NEGATIVE   Leukocytes,Ua NEGATIVE NEGATIVE   RBC / HPF 11-20 0 - 5 RBC/hpf   WBC, UA 0-5 0 - 5 WBC/hpf   Bacteria, UA RARE (A) NONE SEEN   Squamous Epithelial / LPF 0-5 0 - 5   Mucus PRESENT     Comment: Performed at [facility], [place] [address], [place], [place] [address]  Creatinine, serum     Status: None   Collection Time:  07/17/20  5:11 PM  Result Value Ref Range   Creatinine, Ser 0.56 0.44 - 1.00 mg/dL   GFR, Estimated >60 >60 mL/min    Comment: (NOTE) Calculated using the CKD-EPI Creatinine Equation (2021) Performed at [facility], [place] [address], [place], [place] [address]   CBC     Status: None   Collection Time: 07/17/20  6:49 PM  Result Value Ref Range   WBC 5.3 4.0 - 10.5 K/uL   RBC 4.57 3.87 - 5.11 MIL/uL   Hemoglobin 13.5 12.0 - 15.0 g/dL   HCT 41.5 36.0 - 46.0 %   MCV 90.8 80.0 - 100.0 fL   MCH 29.5 26.0 - 34.0 pg   MCHC 32.5 30.0 - 36.0 g/dL   RDW 14.8 11.5 - 15.5 %   Platelets 161 150 - 400 K/uL   nRBC 0.0 0.0 - 0.2 %    Comment: Performed at [facility], [place] [address], [place], [place] [address]   DG Chest Port 1 View  Result Date: 07/17/2020 CLINICAL DATA:  Cough and fatigue.  History of lung cancer EXAM: PORTABLE CHEST 1 VIEW COMPARISON:  CT chest 02/13/2020 FINDINGS: Surgical clips in the left hilum and left lung from prior lobectomy. No recurrent mass or adenopathy. Lungs are well aerated and clear without infiltrate or effusion. Heart size and vascularity normal. IMPRESSION: Postop lobectomy on the left. No acute abnormality no recurrent mass lesion. Electronically Signed   By: Franchot Gallo M.D.   On: 07/17/2020 11:45    Pending Labs Unresulted Labs (From admission, onward)          Start     Ordered   07/24/20 0500  Creatinine, serum  (enoxaparin (LOVENOX)    CrCl >/= 30 ml/min)  Weekly,   R     Comments: while on enoxaparin therapy    07/17/20 1537   07/18/20 0500  C-reactive protein  Daily,   R      07/17/20 1537   07/18/20  0500  D-dimer, quantitative (not at [facility])  Daily,   R      07/17/20 1537   07/18/20 0500  Comprehensive metabolic panel  Daily,   R      07/17/20 1537   07/17/20 1538  HIV Antibody (routine testing w rflx)  (HIV Antibody (Routine testing w reflex) panel)  Once,   STAT        07/17/20 1537   07/17/20 1119   Blood Culture (routine x 2)  BLOOD CULTURE X 2,   STAT      07/17/20 1119   07/17/20 1119  Urine Culture  ONCE - STAT,   STAT        07/17/20 1119          Vitals/Pain Today's Vitals   07/17/20 1815 07/17/20 1824 07/17/20 1830 07/17/20 2030  BP:   119/70 111/70  Pulse: 83  82 81  Resp: 18  (!) 21 19  Temp:  99.9 F (37.7 C)    TempSrc:  Oral    SpO2: 94%  97% 95%  PainSc:  8       Isolation Precautions Airborne and Contact precautions  Medications Medications  enoxaparin (LOVENOX) injection 40 mg (has no administration in time range)  0.9 %  sodium chloride infusion ( Intravenous New Bag/Given 07/17/20 1709)  0.9 %  sodium chloride infusion (has no administration in time range)  diphenhydrAMINE (BENADRYL) injection 50 mg (has no administration in time range)  famotidine (PEPCID) IVPB 20 mg premix (has no administration in time range)  methylPREDNISolone sodium succinate (SOLU-MEDROL) 125 mg/2 mL injection 125 mg (has no administration in time range)  albuterol (VENTOLIN HFA) 108 (90 Base) MCG/ACT inhaler 2 puff (has no administration in time range)  EPINEPHrine (EPI-PEN) injection 0.3 mg (has no administration in time range)  albuterol (VENTOLIN HFA) 108 (90 Base) MCG/ACT inhaler 1-2 puff (has no administration in time range)  ascorbic acid (VITAMIN C) tablet 500 mg (500 mg Oral Given 07/17/20 1816)  zinc sulfate capsule 220 mg (220 mg Oral Given 07/17/20 1818)  atorvastatin (LIPITOR) tablet 20 mg (has no administration in time range)  montelukast (SINGULAIR) tablet 10 mg (has no administration in time range)  umeclidinium bromide (INCRUSE ELLIPTA) 62.5 MCG/INH 1 puff (has no administration in time range)  acetaminophen (TYLENOL) tablet 650 mg (has no administration in time range)  ondansetron (ZOFRAN) injection 4 mg (has no administration in time range)  sodium chloride 0.9 % bolus 1,000 mL (0 mLs Intravenous Stopped 07/17/20 1346)  morphine 4 MG/ML injection 4 mg (4 mg  Intravenous Given 07/17/20 1345)  bamlanivimab 700 mg, etesevimab 1,400 mg in sodium chloride 0.9 % 160 mL IVPB ( Intravenous Stopped 07/17/20 1926)    Mobility walks Low fall risk   Focused Assessments Pulmonary Assessment Handoff:  Lung sounds:   O2 Device: Room Air        R Recommendations: See Admitting Provider Note  Report given to: tom RN Additional Notes:

## 2020-07-17 NOTE — ED Notes (Signed)
Suggested to pt that she attempt to keep down Tylenol and Lipitor PO, due to lack of active or significant vomiting. Pt refused. Will wait for MD to advise.

## 2020-07-17 NOTE — H&P (Signed)
History and Physical    Mindy King VOZ:[id number] DOB: 07/24/1951 DOA: 07/17/2020  PCP: Berkley Harvey, NP  Patient coming from: Home  Chief Complaint: cough, congestion, fever  HPI: Mindy King is a 69 y.o. female with medical history significant of Hx of lung cancer, HLD who presents with 3 days hx of cough, congestion, fevers as well as nausea with vomiting. Pt is not vaccinated against COVID-19.  Reports vomiting prior to admit with resultant weakness. Denies chest pain, SOB. Has nonproductive cough. Reports feeing thirsty and dehydrated  ED Course: In the ED, pt was found to be febrile with tmax of 102.64F.  COVID positive. CXR noted to be clear. CRP 0.6. Pt was started on IVF hydration. Hospitalist consulted for consideration for admission  Review of Systems:  Review of Systems  Constitutional: Positive for fever and malaise/fatigue.  HENT: Positive for congestion. Negative for ear pain and tinnitus.   Eyes: Negative for double vision and photophobia.  Respiratory: Positive for cough. Negative for hemoptysis and sputum production.   Cardiovascular: Negative for palpitations and orthopnea.  Gastrointestinal: Positive for nausea and vomiting. Negative for diarrhea.  Genitourinary: Negative for frequency and urgency.  Musculoskeletal: Negative for back pain and joint pain.  Neurological: Negative for tremors, sensory change, seizures and loss of consciousness.  Psychiatric/Behavioral: Negative for hallucinations and memory loss. The patient is not nervous/anxious.     Past Medical History:  Diagnosis Date   Corns and callosities 10/01/2018   Hyperlipidemia    Lung cancer ([place])    Onychomycosis 10/01/2018    Past Surgical History:  Procedure Laterality Date   LUNG BIOPSY  2012   LUNG CANCER SURGERY  2012     reports that she has been smoking. She has never used smokeless tobacco. She reports previous alcohol use. No history on file for drug use.  Allergies  Allergen  Reactions   Ampicillin    Mometasone Furo-Formoterol Fum Diarrhea    Abdominal cramps   Family history Lung cancer Throat cancer  Prior to Admission medications   Medication Sig Start Date End Date Taking? Authorizing Provider  albuterol (PROVENTIL HFA;VENTOLIN HFA) 108 (90 Base) MCG/ACT inhaler Inhale into the lungs every 6 (six) hours as needed for wheezing or shortness of breath.    [provider]  ascorbic acid (VITAMIN C) 500 MG tablet Take by mouth.    [provider]  aspirin EC 81 MG tablet Take by mouth. 10/13/18   [provider]  atorvastatin (LIPITOR) 20 MG tablet Take 20 mg by mouth daily.    [provider]  azelastine (ASTELIN) 0.1 % nasal spray Place into the nose. 03/29/18   [provider]  B Complex-C (B-COMPLEX WITH VITAMIN C) tablet Take by mouth.    [provider]  Cholecalciferol 25 MCG (1000 UT) tablet Take by mouth. 04/18/14   [provider]  cyanocobalamin 100 MCG tablet Take by mouth.    [provider]  denosumab (PROLIA) 60 MG/ML SOSY injection Inject into the skin. 03/28/18   [provider]  doxycycline (VIBRA-TABS) 100 MG tablet Take 100 mg by mouth 2 (two) times daily. 09/13/19   [provider]  ergocalciferol (VITAMIN D2) 1.25 MG (50000 UT) capsule TK 1 C PO 1 TIME WEEKLY 01/22/18   [provider]  fluconazole (DIFLUCAN) 150 MG tablet TK 1 T PO ONCE FOR 1 DOSE 03/15/19   [provider]  hydrocortisone 2.5 % cream APPLY A THIN LAYER TO RIGHT ARM RASH  TWICE DAILY FOR ONE WEEK 09/13/19   [provider]  montelukast (SINGULAIR) 10 MG tablet Take 10 mg by mouth at bedtime.    [provider]  Multiple Vitamin (DAILY-VITE) TABS TK 1 T PO QD 08/28/18   [provider]  SPIRIVA HANDIHALER 18 MCG inhalation capsule PLACE 1 C INTO INHALER AND INL D 05/22/18   [provider]    Physical Exam: Vitals:   07/17/20 1230  07/17/20 1245 07/17/20 1412 07/17/20 1430  BP:  108/68 119/78 115/73  Pulse:  87 82 81  Resp: 19 (!) 24 19 17   Temp:   (!) 102.3 F (39.1 C)   TempSrc:   Oral   SpO2:  96% 95% 95%    Constitutional: NAD, calm, comfortable Vitals:   07/17/20 1230 07/17/20 1245 07/17/20 1412 07/17/20 1430  BP:  108/68 119/78 115/73  Pulse:  87 82 81  Resp: 19 (!) 24 19 17   Temp:   (!) 102.3 F (39.1 C)   TempSrc:   Oral   SpO2:  96% 95% 95%   Eyes: PERRL, lids and conjunctivae normal ENMT: membranes dry, dentition fair Neck: normal, supple, no masses, no thyromegaly Respiratory: clear to auscultation bilaterally, normal resp effort Cardiovascular: Regular rate, perfused Abdomen: no tenderness, no masses palpated. No hepatosplenomegaly. Bowel sounds positive.  Musculoskeletal: no clubbing / cyanosis. No joint deformity upper and lower extremities. Good ROM, no contractures. Normal muscle tone.  Skin: no rashes, lesions, ulcers. No induration Neurologic: CN 2-12 grossly intact. Strength intact Psychiatric: Normal judgment and insight. Alert and oriented x 3. Normal mood.    Labs on Admission: I have personally reviewed following labs and imaging studies  CBC: Recent Labs  Lab 07/17/20 1224  WBC 5.2  NEUTROABS 3.6  HGB 14.3  HCT 43.2  MCV 90.6  PLT 277   Basic Metabolic Panel: Recent Labs  Lab 07/17/20 1224  NA 138  K 4.2  CL 104  CO2 25  GLUCOSE 94  BUN 9  CREATININE 0.85  CALCIUM 8.3*   GFR: CrCl cannot be calculated (Unknown ideal weight.). Liver Function Tests: Recent Labs  Lab 07/17/20 1224  AST 46*  ALT 35  ALKPHOS 57  BILITOT 0.6  PROT 6.7  ALBUMIN 3.5   No results for input(s): LIPASE, AMYLASE in the last 168 hours. No results for input(s): AMMONIA in the last 168 hours. Coagulation Profile: No results for input(s): INR, PROTIME in the last 168 hours. Cardiac Enzymes: No results for input(s): CKTOTAL, CKMB, CKMBINDEX, TROPONINI in the last 168  hours. BNP (last 3 results) No results for input(s): PROBNP in the last 8760 hours. HbA1C: No results for input(s): HGBA1C in the last 72 hours. CBG: No results for input(s): GLUCAP in the last 168 hours. Lipid Profile: Recent Labs    07/17/20 1224  TRIG 66   Thyroid Function Tests: No results for input(s): TSH, T4TOTAL, FREET4, T3FREE, THYROIDAB in the last 72 hours. Anemia Panel: Recent Labs    07/17/20 1224  FERRITIN 114   Urine analysis: No results found for: COLORURINE, APPEARANCEUR, LABSPEC, PHURINE, GLUCOSEU, HGBUR, BILIRUBINUR, KETONESUR, PROTEINUR, UROBILINOGEN, NITRITE, LEUKOCYTESUR Sepsis Labs: !!!!!!!!!!!!!!!!!!!!!!!!!!!!!!!!!!!!!!!!!!!! @LABRCNTIP (procalcitonin:4,lacticidven:4) ) Recent Results (from the past 240 hour(s))  Resp Panel by RT-PCR (Flu A&B, Covid) Nasopharyngeal Swab     Status: Abnormal   Collection Time: 07/17/20 12:24 PM   Specimen: Nasopharyngeal Swab; Nasopharyngeal(NP) swabs in vial transport medium  Result Value Ref Range Status   SARS Coronavirus 2 by RT PCR POSITIVE (A)  NEGATIVE Final    Comment: RESULT CALLED TO, READ BACK BY AND VERIFIED WITH: S.CLAPP RN AT 1420 ON 07/17/20 BY S.VANHOORNE (NOTE) SARS-CoV-2 target nucleic acids are DETECTED.  The SARS-CoV-2 RNA is generally detectable in upper respiratory specimens during the acute phase of infection. Positive results are indicative of the presence of the identified virus, but do not rule out bacterial infection or co-infection with other pathogens not detected by the test. Clinical correlation with patient history and other diagnostic information is necessary to determine patient infection status. The expected result is Negative.  Fact Sheet for Patients: EntrepreneurPulse.com.au  Fact Sheet for Healthcare Providers: IncredibleEmployment.be  This test is not yet approved or cleared by the [place] FDA and  has been authorized for detection  and/or diagnosis of SARS-CoV-2 by FDA under an Emergency Use Authorization (EUA).  This EUA will remain in effect (meaning this  test can be used) for the duration of  the COVID-19 declaration under Section 564(b)(1) of the Act, 21 U.S.C. section 360bbb-3(b)(1), unless the authorization is terminated or revoked sooner.     Influenza A by PCR NEGATIVE NEGATIVE Final   Influenza B by PCR NEGATIVE NEGATIVE Final    Comment: (NOTE) The Xpert Xpress SARS-CoV-2/FLU/RSV plus assay is intended as an aid in the diagnosis of influenza from Nasopharyngeal swab specimens and should not be used as a sole basis for treatment. Nasal washings and aspirates are unacceptable for Xpert Xpress SARS-CoV-2/FLU/RSV testing.  Fact Sheet for Patients: EntrepreneurPulse.com.au  Fact Sheet for Healthcare Providers: IncredibleEmployment.be  This test is not yet approved or cleared by the [place] FDA and has been authorized for detection and/or diagnosis of SARS-CoV-2 by FDA under an Emergency Use Authorization (EUA). This EUA will remain in effect (meaning this test can be used) for the duration of the COVID-19 declaration under Section 564(b)(1) of the Act, 21 U.S.C. section 360bbb-3(b)(1), unless the authorization is terminated or revoked.  Performed at [facility], [place] [address], [place], [place] [address]      Radiological Exams on Admission: DG Chest Port 1 View  Result Date: 07/17/2020 CLINICAL DATA:  Cough and fatigue.  History of lung cancer EXAM: PORTABLE CHEST 1 VIEW COMPARISON:  CT chest 02/13/2020 FINDINGS: Surgical clips in the left hilum and left lung from prior lobectomy. No recurrent mass or adenopathy. Lungs are well aerated and clear without infiltrate or effusion. Heart size and vascularity normal. IMPRESSION: Postop lobectomy on the left. No acute abnormality no recurrent mass lesion. Electronically Signed   By: Franchot Gallo M.D.   On: 07/17/2020 11:45    EKG: Independently reviewed. 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 COVID infection 1. Pt is unvaccinated 2. Presents with 3 days hx cough, congestion and weakness as well as nausea with vomiting 3. CRP is <1 4. CXR reviewed, neg. Pt is on room air 5. Will give course of monoclonal ab 6. Cont gentle IVF per below 7. Follow inflammatory markers 8. Repeat CMP in AM 2. Hx lung cancer 1. Seems stable at this time 3. Emphysema 1. No audible wheezing noted 2. On minimal O2 support 3. Cont on PRN albuterol 4. Dehydration 1. Likely secondary to recent n/v related to above COVID 2. Cont on gentle IVF  DVT prophylaxis: Lovenox subq  Code Status:  Full Family Communication: Pt in room, family not at bedside  Disposition Plan: Uncertain at this time  Consults called:  Admission status: Observation as it would likely require less than 2 midnight stay for IVF and monoclonal ab infusion   Marylu Lund MD [facility] Pager On Amion  If 7PM-7AM, please contact night-coverage  07/17/2020, 3:00 PM

## 2020-07-17 NOTE — ED Notes (Signed)
Fall Bundle: Yellow socks Fall risk armband Bed alarm on Fall risk sign outside door

## 2020-07-17 NOTE — ED Notes (Signed)
External female ([employer]) attached to patient

## 2020-07-17 NOTE — ED Provider Notes (Signed)
[facility] DEPT Provider Note   CSN: [id number] Arrival date & time: 07/17/20  1059     History Chief Complaint  Patient presents with  . Fever  . Cough  . Fatigue    Mindy King is a 69 y.o. female.  This is a 69 year old female who presents with 3 days of cough, congestion, weakness.  Patient states that she is also have polyuria as well as dysuria.  She has not been vaccinated against Covid.  Developed a temperature at home.  Has been using over-the-counter medications without relief.  Does have a history of lung cancer but is in remission.  Denies any diarrhea.  No severe dyspnea.  Did have emesis x1 yesterday but no associate abdominal discomfort.  Presents via EMS        Past Medical History:  Diagnosis Date  . Corns and callosities 10/01/2018  . Hyperlipidemia   . Lung cancer ([place])   . Onychomycosis 10/01/2018    Patient Active Problem List   Diagnosis Date Noted  . Weight loss 03/21/2019  . Osteoporosis without current pathological fracture 01/22/2019  . 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 Malignant neoplasm of lower lobe of left lung ([place]) 12/13/2018  . Mixed hyperlipidemia 12/13/2018  . Pulmonary emphysema ([place]) 12/13/2018  . Thyroid nodule 12/13/2018  . Tobacco abuse 12/13/2018  . Onychomycosis 10/01/2018  . Corns and callosities 10/01/2018  . Allergic rhinitis 06/21/2018  . Impacted cerumen of both ears 06/21/2018  . Other nonspecific abnormal finding of lung field 03/28/2018  . Vertigo 02/21/2018  . Low TSH level 01/07/2018  . Multiple thyroid nodules 01/07/2018  . Family history of atherosclerosis 09/14/2016  . Chest pain 09/03/2015  . Dyspnea 09/03/2015  . Mitral and aortic incompetence 12/05/2013    Past Surgical History:  Procedure Laterality Date  . LUNG BIOPSY  2012  . LUNG CANCER SURGERY  2012     OB History   No obstetric history on file.     No family history on file.  Social History   Tobacco Use  .  Smoking status: Current Some Day Smoker  . Smokeless tobacco: Never Used  Substance Use Topics  . Alcohol use: Not Currently    Home Medications Prior to Admission medications   Medication Sig Start Date End Date Taking? Authorizing Provider  albuterol (PROVENTIL HFA;VENTOLIN HFA) 108 (90 Base) MCG/ACT inhaler Inhale into the lungs every 6 (six) hours as needed for wheezing or shortness of breath.    [provider]  ascorbic acid (VITAMIN C) 500 MG tablet Take by mouth.    [provider]  aspirin EC 81 MG tablet Take by mouth. 10/13/18   [provider]  atorvastatin (LIPITOR) 20 MG tablet Take 20 mg by mouth daily.    [provider]  azelastine (ASTELIN) 0.1 % nasal spray Place into the nose. 03/29/18   [provider]  B Complex-C (B-COMPLEX WITH VITAMIN C) tablet Take by mouth.    [provider]  Cholecalciferol 25 MCG (1000 UT) tablet Take by mouth. 04/18/14   [provider]  cyanocobalamin 100 MCG tablet Take by mouth.    [provider]  denosumab (PROLIA) 60 MG/ML SOSY injection Inject into the skin. 03/28/18   [provider]  doxycycline (VIBRA-TABS) 100 MG tablet Take 100 mg by mouth 2 (two) times daily. 09/13/19   [provider]  ergocalciferol (VITAMIN D2) 1.25 MG (50000 UT) capsule TK 1 C PO 1 TIME WEEKLY 01/22/18  [provider]  fluconazole (DIFLUCAN) 150 MG tablet TK 1 T PO ONCE FOR 1 DOSE 03/15/19   [provider]  hydrocortisone 2.5 % cream APPLY A THIN LAYER TO RIGHT ARM RASH TWICE DAILY FOR ONE WEEK 09/13/19   [provider]  montelukast (SINGULAIR) 10 MG tablet Take 10 mg by mouth at bedtime.    [provider]  Multiple Vitamin (DAILY-VITE) TABS TK 1 T PO QD 08/28/18   [provider]  SPIRIVA HANDIHALER 18 MCG inhalation capsule PLACE 1 C INTO INHALER AND INL D 05/22/18   [provider]    Allergies    Ampicillin and  Mometasone furo-formoterol fum  Review of Systems   Review of Systems  Constitutional: Negative for chills and fever.  HENT: Negative for ear pain and sore throat.   Eyes: Negative for pain and visual disturbance.  Respiratory: Negative for cough and shortness of breath.   Cardiovascular: Negative for chest pain and palpitations.  Gastrointestinal: Negative for abdominal pain and vomiting.  Genitourinary: Negative for dysuria and hematuria.  Musculoskeletal: Negative for arthralgias and back pain.  Skin: Negative for color change and rash.  Neurological: Negative for seizures and syncope.  All other systems reviewed and are negative.   Physical Exam Updated Vital Signs There were no vitals taken for this visit.  Physical Exam Vitals and nursing note reviewed.  Constitutional:      General: She is not in acute distress.    Appearance: Normal appearance. She is well-developed and well-nourished. She is not toxic-appearing.  HENT:     Head: Normocephalic and atraumatic.  Eyes:     General: Lids are normal.     Extraocular Movements: EOM normal.     Conjunctiva/sclera: Conjunctivae normal.     Pupils: Pupils are equal, round, and reactive to light.  Neck:     Thyroid: No thyroid mass.     Trachea: No tracheal deviation.  Cardiovascular:     Rate and Rhythm: Normal rate and regular rhythm.     Heart sounds: Normal heart sounds. No murmur heard. No gallop.   Pulmonary:     Effort: Pulmonary effort is normal. No respiratory distress.     Breath sounds: No stridor. Decreased breath sounds present. No wheezing, rhonchi or rales.  Abdominal:     General: Bowel sounds are normal. There is no distension.     Palpations: Abdomen is soft.     Tenderness: There is no abdominal tenderness. There is no CVA tenderness or rebound.  Musculoskeletal:        General: No tenderness or edema. Normal range of motion.     Cervical back: Normal range of motion and neck supple.  Skin:     General: Skin is warm and dry.     Findings: No abrasion or rash.  Neurological:     Mental Status: She is alert and oriented to person, place, and time.     GCS: GCS eye subscore is 4. GCS verbal subscore is 5. GCS motor subscore is 6.     Cranial Nerves: No cranial nerve deficit.     Sensory: No sensory deficit.     Deep Tendon Reflexes: Strength normal.  Psychiatric:        Mood and Affect: Mood and affect normal.        Speech: Speech normal.        Behavior: Behavior normal.     ED Results / Procedures / Treatments   Labs (all labs  ordered are listed, but only abnormal results are displayed) Labs Reviewed  RESP PANEL BY RT-PCR (FLU A&B, COVID) ARPGX2  CULTURE, BLOOD (ROUTINE X 2)  CULTURE, BLOOD (ROUTINE X 2)  URINE CULTURE  LACTIC ACID, PLASMA  LACTIC ACID, PLASMA  CBC WITH DIFFERENTIAL/PLATELET  COMPREHENSIVE METABOLIC PANEL  D-DIMER, QUANTITATIVE (NOT AT [facility])  PROCALCITONIN  LACTATE DEHYDROGENASE  FERRITIN  TRIGLYCERIDES  FIBRINOGEN  C-REACTIVE PROTEIN  URINALYSIS, ROUTINE W REFLEX MICROSCOPIC    EKG None  Radiology No results found.  Procedures Procedures (including critical care time)  Medications Ordered in ED Medications  0.9 %  sodium chloride infusion (has no administration in time range)  sodium chloride 0.9 % bolus 1,000 mL (has no administration in time range)    ED Course  I have reviewed the triage vital signs and the nursing notes.  Pertinent labs & imaging results that were available during my care of the patient were reviewed by me and considered in my medical decision making (see chart for details).    MDM Rules/Calculators/A&P                         Patient is Covid positive here as well as febrile.  Complains of weakness and dehydration.  Will admit for observation  Final Clinical Impression(s) / ED Diagnoses Final diagnoses:  None    Rx / DC Orders ED Discharge Orders    None       Lacretia Leigh, MD 07/17/20  1446

## 2020-07-17 NOTE — ED Triage Notes (Signed)
Pt bib ems from home following n/v, fever, cough, and fatigue x2 days. Hx lung cancer, in remission. A/Ox4, ambulatory. Pt NOT vaccinated against covid-19.  EMS vitals: 98% RA 138/80 HR 100 RR 22 101.53f

## 2020-07-17 NOTE — ED Notes (Signed)
Patient stated that she is very cold Advised patient that she has a high fever Patient insisted to have numerous blankets

## 2020-07-18 DIAGNOSIS — U071 COVID-19: Secondary | ICD-10-CM | POA: Diagnosis not present

## 2020-07-18 DIAGNOSIS — E782 Mixed hyperlipidemia: Secondary | ICD-10-CM

## 2020-07-18 DIAGNOSIS — C3432 Malignant neoplasm of lower lobe, left bronchus or lung: Secondary | ICD-10-CM

## 2020-07-18 DIAGNOSIS — Z23 Encounter for immunization: Secondary | ICD-10-CM | POA: Diagnosis not present

## 2020-07-18 DIAGNOSIS — J1282 Pneumonia due to coronavirus disease 2019: Secondary | ICD-10-CM | POA: Diagnosis not present

## 2020-07-18 LAB — COMPREHENSIVE METABOLIC PANEL
ALT: 29 U/L (ref 0–44)
AST: 33 U/L (ref 15–41)
Albumin: 2.8 g/dL — ABNORMAL LOW (ref 3.5–5.0)
Alkaline Phosphatase: 47 U/L (ref 38–126)
Anion gap: 9 (ref 5–15)
BUN: 8 mg/dL (ref 8–23)
CO2: 24 mmol/L (ref 22–32)
Calcium: 7.1 mg/dL — ABNORMAL LOW (ref 8.9–10.3)
Chloride: 106 mmol/L (ref 98–111)
Creatinine, Ser: 0.64 mg/dL (ref 0.44–1.00)
GFR, Estimated: >60 mL/min (ref >60)
Glucose, Bld: 82 mg/dL (ref 70–99)
Potassium: 3.4 mmol/L — ABNORMAL LOW (ref 3.5–5.1)
Sodium: 139 mmol/L (ref 135–145)
Total Bilirubin: 0.5 mg/dL (ref 0.3–1.2)
Total Protein: 5.3 g/dL — ABNORMAL LOW (ref 6.5–8.1)

## 2020-07-18 LAB — C-REACTIVE PROTEIN: CRP: 1.7 mg/dL — ABNORMAL HIGH (ref <1.0)

## 2020-07-18 LAB — D-DIMER, QUANTITATIVE: D-Dimer, Quant: 0.66 ug/mL-FEU — ABNORMAL HIGH (ref 0.00–0.50)

## 2020-07-18 NOTE — Progress Notes (Signed)
   07/17/20 2212  Assess: MEWS Score  Temp (!) 101.6 F (38.7 C)  BP 116/69  Pulse Rate 80  Resp 20  SpO2 96 %  Assess: MEWS Score  MEWS Temp 2  MEWS Systolic 0  MEWS Pulse 0  MEWS RR 0  MEWS LOC 0  MEWS Score 2  MEWS Score Color Yellow  Assess: if the MEWS score is Yellow or Red  Were vital signs taken at a resting state? Yes  Focused Assessment No change from prior assessment  Early Detection of Sepsis Score *See Row Information* Medium  MEWS guidelines implemented *See Row Information* Yes  Treat  MEWS Interventions Administered scheduled meds/treatments  Pain Scale 0-10  Pain Score 0  Take Vital Signs  Increase Vital Sign Frequency  Yellow: Q 2hr X 2 then Q 4hr X 2, if remains yellow, continue Q 4hrs  Escalate  MEWS: Escalate Yellow: discuss with charge nurse/RN and consider discussing with provider and RRT  Notify: Charge Nurse/RN  Name of Charge Nurse/RN Notified Kathyrn Lass  Date Charge Nurse/RN Notified 07/17/20  Time Charge Nurse/RN Notified 2218  Notify: Provider  Provider Name/Title Blout  Date Provider Notified 07/17/20  Time Provider Notified 2219  Notification Type Page  Notification Reason Change in status  Response No new orders  Date of Provider Response  (no response)  Time of Provider Response  (no response)  Document  Patient Outcome Stabilized after interventions  Progress note created (see row info) Yes

## 2020-07-18 NOTE — Discharge Summary (Signed)
Physician Discharge Summary  Mindy King ZYS:[id number] DOB: 1951-05-19 DOA: 07/17/2020  PCP: Berkley Harvey, NP  Admit date: 07/17/2020 Discharge date: 07/18/2020  Admitted From: home Disposition:  home  Recommendations for Outpatient Follow-up:  1. Follow up with PCP in 1-2 weeks  Home Health: none Equipment/Devices: none  Discharge Condition: stable CODE STATUS: Full code Diet recommendation: regular  HPI: Per admitting MD, Mindy King is a 69 y.o. female with medical history significant of Hx of lung cancer, HLD who presents with 3 days hx of cough, congestion, fevers as well as nausea with vomiting. Pt is not vaccinated against COVID-19.  Reports vomiting prior to admit with resultant weakness. Denies chest pain, SOB. Has nonproductive cough. Reports feeing thirsty and dehydrated ED Course: In the ED, pt was found to be febrile with tmax of 102.18F.  COVID positive. CXR noted to be clear. CRP 0.6. Pt was started on IVF hydration. Hospitalist consulted for consideration for admission  Hospital Course / Discharge diagnoses: Principal problem COVID-19 infection-patient is unvaccinated, she came to the hospital with 3 days history of a dry cough, congestion and weakness.  Imaging on admission was negative for pneumonia, she maintained excellent oxygen saturations on room air and did not have any shortness of breath.  She did not receive steroids or Remdesivir is still there was no indication.  She received monoclonal antibodies, and was admitted to the hospital for dehydration.  Patient advised to seek care immediately if she develops new symptoms such as worsening cough or shortness of breath  Active problems  Nausea, vomiting, poor p.o. intake - This has been going on for the last several days.  She is very dehydrated, received IV fluids, overall much improved and her energy levels and her appetite is back, able to tolerate a regular diet. History of lung cancer, emphysema,  ongoing tobacco use -outpatient follow-up, she was counseled for cessation  Sepsis ruled out  Discharge Instructions   Allergies as of 07/18/2020      Reactions   Ampicillin    Mometasone Furo-formoterol Fum Diarrhea   Abdominal cramps      Medication List    TAKE these medications   acetaminophen 500 MG tablet Commonly known as: TYLENOL Take 1,000 mg by mouth every 6 (six) hours as needed for fever.   albuterol 108 (90 Base) MCG/ACT inhaler Commonly known as: VENTOLIN HFA Inhale 1 puff into the lungs every 6 (six) hours as needed for wheezing or shortness of breath.   ascorbic acid 500 MG tablet Commonly known as: VITAMIN C Take 1,000 mg by mouth daily.   atorvastatin 20 MG tablet Commonly known as: LIPITOR Take 20 mg by mouth daily.   azelastine 0.1 % nasal spray Commonly known as: ASTELIN Place 1 spray into both nostrils daily as needed for rhinitis.   B-complex with vitamin C tablet Take 1 tablet by mouth daily.   Cholecalciferol 25 MCG (1000 UT) tablet Take 1,000 Units by mouth daily.   cyanocobalamin 100 MCG tablet Take 100 mcg by mouth daily.   Daily-Vite Tabs Take 1 tablet by mouth daily.   denosumab 60 MG/ML Sosy injection Commonly known as: PROLIA Inject 60 mg into the skin every 6 (six) months.   hydrocortisone 2.5 % cream Apply 1 application topically daily as needed (itching).   montelukast 10 MG tablet Commonly known as: SINGULAIR Take 10 mg by mouth at bedtime.   Spiriva HandiHaler 18 MCG inhalation capsule Generic drug: tiotropium Place 1 capsule into inhaler and  inhale daily.       Follow-up Information    Berkley Harvey, NP. Schedule an appointment as soon as possible for a visit in 1 week(s).   Specialty: Nurse Practitioner Contact information: [place] [address] [phone]               Consultations:  None   Procedures/Studies:  None   DG Chest Port 1 View  Result Date:  07/17/2020 CLINICAL DATA:  Cough and fatigue.  History of lung cancer EXAM: PORTABLE CHEST 1 VIEW COMPARISON:  CT chest 02/13/2020 FINDINGS: Surgical clips in the left hilum and left lung from prior lobectomy. No recurrent mass or adenopathy. Lungs are well aerated and clear without infiltrate or effusion. Heart size and vascularity normal. IMPRESSION: Postop lobectomy on the left. No acute abnormality no recurrent mass lesion. Electronically Signed   By: Franchot Gallo M.D.   On: 07/17/2020 11:45      Subjective: - no chest pain, shortness of breath, no abdominal pain, nausea or vomiting.   Discharge Exam: BP 117/79 (BP Location: Left Arm)   Pulse 74   Temp 98.8 F (37.1 C) (Oral)   Resp 18   SpO2 95%   General: Pt is alert, awake, not in acute distress Cardiovascular: RRR, S1/S2 +, no rubs, no gallops Respiratory: CTA bilaterally, no wheezing, no rhonchi Abdominal: Soft, NT, ND, bowel sounds + Extremities: no edema, no cyanosis   The results of significant diagnostics from this hospitalization (including imaging, microbiology, ancillary and laboratory) are listed below for reference.     Microbiology: Recent Results (from the past 240 hour(s))  Resp Panel by RT-PCR (Flu A&B, Covid) Nasopharyngeal Swab     Status: Abnormal   Collection Time: 07/17/20 12:24 PM   Specimen: Nasopharyngeal Swab; Nasopharyngeal(NP) swabs in vial transport medium  Result Value Ref Range Status   SARS Coronavirus 2 by RT PCR POSITIVE (A) NEGATIVE Final    Comment: RESULT CALLED TO, READ BACK BY AND VERIFIED WITH: S.CLAPP RN AT 1420 ON 07/17/20 BY S.VANHOORNE (NOTE) SARS-CoV-2 target nucleic acids are DETECTED.  The SARS-CoV-2 RNA is generally detectable in upper respiratory specimens during the acute phase of infection. Positive results are indicative of the presence of the identified virus, but do not rule out bacterial infection or co-infection with other pathogens not detected by the test.  Clinical correlation with patient history and other diagnostic information is necessary to determine patient infection status. The expected result is Negative.  Fact Sheet for Patients: EntrepreneurPulse.com.au  Fact Sheet for Healthcare Providers: IncredibleEmployment.be  This test is not yet approved or cleared by the [place] FDA and  has been authorized for detection and/or diagnosis of SARS-CoV-2 by FDA under an Emergency Use Authorization (EUA).  This EUA will remain in effect (meaning this  test can be used) for the duration of  the COVID-19 declaration under Section 564(b)(1) of the Act, 21 U.S.C. section 360bbb-3(b)(1), unless the authorization is terminated or revoked sooner.     Influenza A by PCR NEGATIVE NEGATIVE Final   Influenza B by PCR NEGATIVE NEGATIVE Final    Comment: (NOTE) The Xpert Xpress SARS-CoV-2/FLU/RSV plus assay is intended as an aid in the diagnosis of influenza from Nasopharyngeal swab specimens and should not be used as a sole basis for treatment. Nasal washings and aspirates are unacceptable for Xpert Xpress SARS-CoV-2/FLU/RSV testing.  Fact Sheet for Patients: EntrepreneurPulse.com.au  Fact Sheet for Healthcare Providers: IncredibleEmployment.be  This test is  not yet approved or cleared by the [place] and has been authorized for detection and/or diagnosis of SARS-CoV-2 by FDA under an Emergency Use Authorization (EUA). This EUA will remain in effect (meaning this test can be used) for the duration of the COVID-19 declaration under Section 564(b)(1) of the Act, 21 U.S.C. section 360bbb-3(b)(1), unless the authorization is terminated or revoked.  Performed at [facility], [place] [address] [place], [place] [address]      Labs: Basic Metabolic Panel: Recent Labs  Lab 07/17/20 1224 07/17/20 1711 07/18/20 0335  NA 138  --  139  K  4.2  --  3.4*  CL 104  --  106  CO2 25  --  24  GLUCOSE 94  --  82  BUN 9  --  8  CREATININE 0.85 0.56 0.64  CALCIUM 8.3*  --  7.1*   Liver Function Tests: Recent Labs  Lab 07/17/20 1224 07/18/20 0335  AST 46* 33  ALT 35 29  ALKPHOS 57 47  BILITOT 0.6 0.5  PROT 6.7 5.3*  ALBUMIN 3.5 2.8*   CBC: Recent Labs  Lab 07/17/20 1224 07/17/20 1849  WBC 5.2 5.3  NEUTROABS 3.6  --   HGB 14.3 13.5  HCT 43.2 41.5  MCV 90.6 90.8  PLT 186 161   CBG: No results for input(s): GLUCAP in the last 168 hours. Hgb A1c No results for input(s): HGBA1C in the last 72 hours. Lipid Profile Recent Labs    07/17/20 1224  TRIG 66   Thyroid function studies No results for input(s): TSH, T4TOTAL, T3FREE, THYROIDAB in the last 72 hours.  Invalid input(s): FREET3 Urinalysis    Component Value Date/Time   COLORURINE YELLOW 07/17/2020 1711   APPEARANCEUR HAZY (A) 07/17/2020 1711   LABSPEC 1.016 07/17/2020 1711   PHURINE 5.0 07/17/2020 1711   GLUCOSEU NEGATIVE 07/17/2020 1711   HGBUR SMALL (A) 07/17/2020 1711   BILIRUBINUR NEGATIVE 07/17/2020 1711   KETONESUR 5 (A) 07/17/2020 1711   PROTEINUR NEGATIVE 07/17/2020 1711   NITRITE NEGATIVE 07/17/2020 1711   LEUKOCYTESUR NEGATIVE 07/17/2020 1711    FURTHER DISCHARGE INSTRUCTIONS:   Get Medicines reviewed and adjusted: Please take all your medications with you for your next visit with your Primary MD   Laboratory/radiological data: Please request your Primary MD to go over all hospital tests and procedure/radiological results at the follow up, please ask your Primary MD to get all Hospital records sent to his/her office.   In some cases, they will be blood work, cultures and biopsy results pending at the time of your discharge. Please request that your primary care M.D. goes through all the records of your hospital data and follows up on these results.   Also Note the following: If you experience worsening of your admission symptoms,  develop shortness of breath, life threatening emergency, suicidal or homicidal thoughts you must seek medical attention immediately by calling 911 or calling your MD immediately  if symptoms less severe.   You must read complete instructions/literature along with all the possible adverse reactions/side effects for all the Medicines you take and that have been prescribed to you. Take any new Medicines after you have completely understood and accpet all the possible adverse reactions/side effects.    Do not drive when taking Pain medications or sleeping medications (Benzodaizepines)   Do not take more than prescribed Pain, Sleep and Anxiety Medications. It is not advisable to combine anxiety,sleep and pain medications without talking with your primary  care practitioner   Special Instructions: If you have smoked or chewed Tobacco  in the last 2 yrs please stop smoking, stop any regular Alcohol  and or any Recreational drug use.   Wear Seat belts while driving.   Please note: You were cared for by a hospitalist during your hospital stay. Once you are discharged, your primary care physician will handle any further medical issues. Please note that NO REFILLS for any discharge medications will be authorized once you are discharged, as it is imperative that you return to your primary care physician (or establish a relationship with a primary care physician if you do not have one) for your post hospital discharge needs so that they can reassess your need for medications and monitor your lab values.  Time coordinating discharge: 35 minutes  SIGNED:  Marzetta Board, MD, PhD 07/18/2020, 10:33 AM

## 2020-07-18 NOTE — Discharge Instructions (Signed)
10 Things You Can Do to Manage Your COVID-19 Symptoms at Home If you have possible or confirmed COVID-19: 1. Stay home from work and school. And stay away from other public places. If you must go out, avoid using any kind of public transportation, ridesharing, or taxis. 2. Monitor your symptoms carefully. If your symptoms get worse, call your healthcare provider immediately. 3. Get rest and stay hydrated. 4. If you have a medical appointment, call the healthcare provider ahead of time and tell them that you have or may have COVID-19. 5. For medical emergencies, call 911 and notify the dispatch personnel that you have or may have COVID-19. 6. Cover your cough and sneezes with a tissue or use the inside of your elbow. 7. Wash your hands often with soap and water for at least 20 seconds or clean your hands with an alcohol-based hand sanitizer that contains at least 60% alcohol. 8. As much as possible, stay in a specific room and away from other people in your home. Also, you should use a separate bathroom, if available. If you need to be around other people in or outside of the home, wear a mask. 9. Avoid sharing personal items with other people in your household, like dishes, towels, and bedding. 10. Clean all surfaces that are touched often, like counters, tabletops, and doorknobs. Use household cleaning sprays or wipes according to the label instructions. [email or web address].[email or web address] 01/30/2019 This information is not intended to replace advice given to you by your health care provider. Make sure you discuss any questions you have with your health care provider. Document Revised: 07/04/2019 Document Reviewed: 07/04/2019 Elsevier Patient Education  [employer].  If you develop shortness of breath or your cough gets worse please go to the nearest emergency room

## 2020-07-19 LAB — URINE CULTURE

## 2020-07-22 LAB — CULTURE, BLOOD (ROUTINE X 2)
Culture: NO GROWTH
Culture: NO GROWTH
Special Requests: ADEQUATE
Special Requests: ADEQUATE

## 2020-08-04 DIAGNOSIS — F32A Depression, unspecified: Secondary | ICD-10-CM | POA: Insufficient documentation

## 2020-08-04 DIAGNOSIS — F419 Anxiety disorder, unspecified: Secondary | ICD-10-CM | POA: Insufficient documentation

## 2020-09-02 ENCOUNTER — Ambulatory Visit (INDEPENDENT_AMBULATORY_CARE_PROVIDER_SITE_OTHER): Payer: 59 | Admitting: Podiatry

## 2020-09-02 ENCOUNTER — Encounter: Payer: Self-pay | Admitting: Podiatry

## 2020-09-02 ENCOUNTER — Other Ambulatory Visit: Payer: Self-pay

## 2020-09-02 DIAGNOSIS — G620 Drug-induced polyneuropathy: Secondary | ICD-10-CM

## 2020-09-02 DIAGNOSIS — T451X5A Adverse effect of antineoplastic and immunosuppressive drugs, initial encounter: Secondary | ICD-10-CM | POA: Diagnosis not present

## 2020-09-02 DIAGNOSIS — L84 Corns and callosities: Secondary | ICD-10-CM | POA: Diagnosis not present

## 2020-09-02 NOTE — Patient Instructions (Signed)
Corns and Calluses Corns are small areas of thickened skin that form on the top, sides, or tip of a toe. Corns have a cone-shaped core with a point that can press on a nerve below. This causes pain. Calluses are areas of thickened skin that can form anywhere on the body, including the hands, fingers, palms, soles of the feet, and heels. Calluses are usually larger than corns. What are the causes? Corns and calluses are caused by rubbing (friction) or pressure, such as from shoes that are too tight or do not fit properly. What increases the risk? Corns are more likely to develop in people who have misshapen toes (toe deformities), such as hammer toes. Calluses can form with friction to any area of the skin. They are more likely to develop in people who:  Work with their hands.  Wear shoes that fit poorly, are too tight, or are high-heeled.  Have toe deformities. What are the signs or symptoms? Symptoms of a corn or callus include:  A hard growth on the skin.  Pain or tenderness under the skin.  Redness and swelling.  Increased discomfort while wearing tight-fitting shoes, if your feet are affected. If a corn or callus becomes infected, symptoms may include:  Redness and swelling that gets worse.  Pain.  Fluid, blood, or pus draining from the corn or callus.   How is this diagnosed? Corns and calluses may be diagnosed based on your symptoms, your medical history, and a physical exam. How is this treated? Treatment for corns and calluses may include:  Removing the cause of the friction or pressure. This may involve: ? Changing your shoes. ? Wearing shoe inserts (orthotics) or other protective layers in your shoes, such as a corn pad. ? Wearing gloves.  Applying medicine to the skin (topical medicine) to help soften skin in the hardened, thickened areas.  Removing layers of dead skin with a file to reduce the size of the corn or callus.  Removing the corn or callus with a  scalpel or laser.  Taking antibiotic medicines, if your corn or callus is infected.  Having surgery, if a toe deformity is the cause. Follow these instructions at home:  Take over-the-counter and prescription medicines only as told by your health care provider.  If you were prescribed an antibiotic medicine, take it as told by your health care provider. Do not stop taking it even if your condition improves.  Wear shoes that fit well. Avoid wearing high-heeled shoes and shoes that are too tight or too loose.  Wear any padding, protective layers, gloves, or orthotics as told by your health care provider.  Soak your hands or feet. Then use a file or pumice stone to soften your corn or callus. Do this as told by your health care provider.  Check your corn or callus every day for signs of infection.   Contact a health care provider if:  Your symptoms do not improve with treatment.  You have redness or swelling that gets worse.  Your corn or callus becomes painful.  You have fluid, blood, or pus coming from your corn or callus.  You have new symptoms. Get help right away if:  You develop severe pain with redness. Summary  Corns are small areas of thickened skin that form on the top, sides, or tip of a toe. These can be painful.  Calluses are areas of thickened skin that can form anywhere on the body, including the hands, fingers, palms, and soles of the  feet. Calluses are usually larger than corns.  Corns and calluses are caused by rubbing (friction) or pressure, such as from shoes that are too tight or do not fit properly.  Treatment may include wearing padding, protective layers, gloves, or orthotics as told by your health care provider. This information is not intended to replace advice given to you by your health care provider. Make sure you discuss any questions you have with your health care provider. Document Revised: 11/14/2019 Document Reviewed: 11/14/2019 Elsevier  Patient Education  2021 [employer].

## 2020-09-08 NOTE — Progress Notes (Signed)
Subjective: Mindy King presents today for follow up. She has h/o neuropathy secondary to chemotherapy. She has c/o of painful porokeratotic lesion(s) b/l feet that limit ambulation. Aggravating factors include weightbearing with and without shoe gear. Pain for both is relieved with periodic professional debridement.   Allergies  Allergen Reactions   Ampicillin    Mometasone Furo-Formoterol Fum Diarrhea    Abdominal cramps     Objective: There were no vitals filed for this visit.  Pt 70 y.o. year old Mindy King female, thin build in NAD. AAO x 3.   Vascular Examination:  Capillary fill time to digits <3 seconds b/l. Palpable DP pulses b/l. Palpable PT pulses b/l. Pedal hair sparse b/l. Skin temperature gradient within normal limits b/l.  Dermatological Examination: Pedal skin with normal turgor, texture and tone bilaterally. No open wounds bilaterally. No interdigital macerations bilaterally. Toenails 1-5 adequate length. Porokeratotic lesion(s) L hallux, L 2nd toe, R hallux, R 2nd toe, submet head 1 right foot, submet head 4 right foot, submet head 5 left foot and submet head 5 right foot. No erythema, no edema, no drainage, no flocculence.  Musculoskeletal: Normal muscle strength 5/5 to all lower extremity muscle groups bilaterally, no pain crepitus or joint limitation noted with ROM b/l, bunion deformity noted b/l and hammertoes noted to the  2-5 bilaterally.  Neurological: Pt has subjective symptoms of neuropathy. Protective sensation intact 5/5 intact bilaterally with 10g monofilament b/l. Vibratory sensation intact b/l.  Assessment: 1. 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 -Patient to continue  soft, supportive shoe gear daily. -Patient to report any pedal injuries to medical professional immediately. -Patient/POA to call should there be question/concern in the interim.  Return in about 3 months (around 11/30/2020) for callus trim.

## 2020-09-15 LAB — UNMAPPED LAB RESULTS
ABO RH Blood Type (HT): A POS
Antibody Screen (HT): NEGATIVE
Hemoglobin (HGB) (HT): 13.7 g/dL (ref 11.5–16.0)

## 2020-09-29 LAB — UNMAPPED LAB RESULTS
Hematocrit (HT): 41 % (ref 40–52)
Hemoglobin (HGB) (HT): 13.2 g/dL (ref 11.5–16.0)
MCHC (HT): 32.3 g/dL (ref 32.0–36.0)
MCV (HT): 85 fL (ref 81–99)
Mean Corpuscular Hemoglobin (MCH) (HT): 27.6 pg (ref 26.0–34.0)
Platelets (HT): 200 10 3/uL (ref 140–400)
RBC (HT): 4.79 10 6/uL (ref 3.80–5.20)
RDW (HT): 14 % (ref 11.5–15.0)
WBC (HT): 12.4 10 3/uL — ABNORMAL HIGH (ref 4.0–10.0)

## 2020-09-30 LAB — UNMAPPED LAB RESULTS
HBV Core Ab (HT): NEGATIVE
HIV 1&2 ANTIGEN/ANTIBODY (HT): NONREACTIVE
Hematocrit (HT): 38 % — ABNORMAL LOW (ref 40–52)
Hemoglobin (HGB) (HT): 12.3 g/dL (ref 11.5–16.0)
Hep C Ab: NEGATIVE
MCHC (HT): 32 g/dL (ref 32.0–36.0)
MCV (HT): 88 fL (ref 81–99)
Mean Corpuscular Hemoglobin (MCH) (HT): 28 pg (ref 26.0–34.0)
Platelets (HT): 223 10 3/uL (ref 140–400)
RBC (HT): 4.39 10 6/uL (ref 3.80–5.20)
RDW (HT): 14.4 % (ref 11.5–15.0)
WBC (HT): 8.3 10 3/uL (ref 4.0–10.0)

## 2020-12-09 LAB — UNMAPPED LAB RESULTS
ABO RH Blood Type (HT): A POS
Antibody Screen (HT): NEGATIVE
Hemoglobin (HGB) (HT): 13.9 g/dL (ref 11.5–16.0)

## 2020-12-16 ENCOUNTER — Ambulatory Visit: Payer: 59 | Admitting: Podiatry

## 2020-12-19 LAB — UNMAPPED LAB RESULTS
Hematocrit (HT): 38 % — ABNORMAL LOW (ref 40–52)
Hemoglobin (HGB) (HT): 12 g/dL (ref 11.5–16.0)
MCHC (HT): 31.7 g/dL — ABNORMAL LOW (ref 32.0–36.0)
MCV (HT): 84 fL (ref 81–99)
Mean Corpuscular Hemoglobin (MCH) (HT): 26.5 pg (ref 26.0–34.0)
Platelets (HT): 214 10 3/uL (ref 140–400)
RBC (HT): 4.52 10 6/uL (ref 3.80–5.20)
RDW (HT): 14.6 % (ref 11.5–15.0)
WBC (HT): 12.5 10 3/uL — ABNORMAL HIGH (ref 4.0–10.0)

## 2021-01-04 LAB — UNMAPPED LAB RESULTS
Basophil # (HT): 0 10 3/uL (ref 0.0–0.2)
Basophil % (HT): 1 % (ref 0–3)
Eosinophil # (HT): 0.2 10 3/uL (ref 0.1–0.6)
Eosinophil % (HT): 4 % (ref 0–5)
Hematocrit (HT): 30 % — ABNORMAL LOW (ref 35–47)
Hemoglobin (HGB) (HT): 9.8 g/dL — ABNORMAL LOW (ref 12.0–16.0)
Lymphocyte # (HT): 1.3 10 3/uL (ref 1.0–4.8)
Lymphocyte % (HT): 23 % (ref 15–45)
MCHC (HT): 32.2 g/dL (ref 31.0–37.5)
MCV (HT): 83 fL (ref 80–100)
Mean Corpuscular Hemoglobin (MCH) (HT): 26.6 pg (ref 26.0–34.0)
Monocyte # (HT): 0.5 10 3/uL (ref 0.1–1.0)
Monocyte % (HT): 9 % (ref 0–15)
Neutrophil # (HT): 3.4 10 3/uL (ref 1.8–8.0)
Platelets (HT): 278 10 3/uL (ref 150–450)
RBC (HT): 3.68 10 6/uL — ABNORMAL LOW (ref 3.80–5.20)
RDW (HT): 16.3 % — ABNORMAL HIGH (ref 9.0–15.2)
Seg Neut % (HT): 63 % (ref 45–75)
WBC (HT): 5.5 10 3/uL (ref 4.0–11.0)

## 2021-01-27 ENCOUNTER — Ambulatory Visit (INDEPENDENT_AMBULATORY_CARE_PROVIDER_SITE_OTHER): Payer: 59 | Admitting: Podiatry

## 2021-01-27 ENCOUNTER — Encounter: Payer: Self-pay | Admitting: Podiatry

## 2021-01-27 ENCOUNTER — Other Ambulatory Visit: Payer: Self-pay

## 2021-01-27 DIAGNOSIS — Q828 Other specified congenital malformations of skin: Secondary | ICD-10-CM

## 2021-01-27 DIAGNOSIS — M79671 Pain in right foot: Secondary | ICD-10-CM | POA: Diagnosis not present

## 2021-01-27 DIAGNOSIS — B351 Tinea unguium: Secondary | ICD-10-CM | POA: Diagnosis not present

## 2021-01-27 DIAGNOSIS — T451X5A Adverse effect of antineoplastic and immunosuppressive drugs, initial encounter: Secondary | ICD-10-CM

## 2021-01-27 DIAGNOSIS — M79672 Pain in left foot: Secondary | ICD-10-CM | POA: Diagnosis not present

## 2021-01-27 DIAGNOSIS — G620 Drug-induced polyneuropathy: Secondary | ICD-10-CM

## 2021-01-27 MED ORDER — CICLOPIROX 8 % EX SOLN: CUTANEOUS | 11 refills | Status: DC

## 2021-01-27 NOTE — Patient Instructions (Addendum)
Oofos Oomg Eezee Low Shoes with stretchable uppers can be purchased online at: www.oofos.com. They can also be purchased at [place] store [employer]), [place] or [employer].  Ciclopirox nail solution What is this medication? CICLOPIROX (sye kloe PEER ox) NAIL SOLUTION is an antifungal medicine. It usedto treat fungal infections of the nails. This medicine may be used for other purposes; ask your health care provider orpharmacist if you have questions. COMMON BRAND NAME(S): [id number], [place] What should I tell my care team before I take this medication? They need to know if you have any of these conditions: diabetes mellitus history of seizures HIV infection immune system problems or organ transplant large areas of burned or damaged skin peripheral vascular disease or poor circulation taking corticosteroid medication (including steroid inhalers, cream, or lotion) an unusual or allergic reaction to ciclopirox, isopropyl alcohol, other medicines, foods, dyes, or preservatives pregnant or trying to get pregnant breast-feeding How should I use this medication? This medicine is for external use only. Follow the directions that come with this medicine exactly. Wash and dry your hands before use. Avoid contact with the eyes, mouth or nose. If you do get this medicine in your eyes, rinse out with plenty of cool tap water. Contact your doctor or health care professional if eye irritation occurs. Use at regular intervals. Do not use your medicine more often than directed. Finish the full course prescribed by your doctor or health care professional even if you think you are better. Do not stop usingexcept on your doctor's advice. Talk to your pediatrician regarding the use of this medicine in children. While this medicine may be prescribed for children as young as 12 years for selectedconditions, precautions do apply. Overdosage: If you think you have taken too much of this medicine contact  apoison control center or emergency room at once. NOTE: This medicine is only for you. Do not share this medicine with others. What if I miss a dose? If you miss a dose, use it as soon as you can. If it is almost time for yournext dose, use only that dose. Do not use double or extra doses. What may interact with this medication? Interactions are not expected. Do not use any other skin products withouttelling your doctor or health care professional. This list may not describe all possible interactions. Give your health care provider a list of all the medicines, herbs, non-prescription drugs, or dietary supplements you use. Also tell them if you smoke, drink alcohol, or use illegaldrugs. Some items may interact with your medicine. What should I watch for while using this medication? Tell your doctor or health care professional if your symptoms get worse. Four to six months of treatment may be needed for the nail(s) to improve. Somepeople may not achieve a complete cure or clearing of the nails by this time. Tell your doctor or health care professional if you develop sores or blisters that do not heal properly. If your nail infection returns after stopping usingthis product, contact your doctor or health care professional. What side effects may I notice from receiving this medication? Side effects that you should report to your doctor or health care professionalas soon as possible: allergic reactions like skin rash, itching or hives, swelling of the face, lips, or tongue severe irritation, redness, burning, blistering, peeling, swelling, oozing Side effects that usually do not require medical attention (report to yourdoctor or health care professional if they continue or are bothersome): mild reddening of the skin nail discoloration  temporary burning or mild stinging at the site of application This list may not describe all possible side effects. Call your doctor for medical advice about side effects.  You may report side effects to FDA at1-800-FDA-1088. Where should I keep my medication? Keep out of the reach of children. Store at room temperature between 15 and 30 degrees C (59 and 86 degrees F). Do not freeze. Protect from light by storing the bottle in the carton after every use. This medicine is flammable. Keep away from heat and flame. Throw away anyunused medicine after the expiration date. NOTE: This sheet is a summary. It may not cover all possible information. If you have questions about this medicine, talk to your doctor, pharmacist, orhealth care provider.  2022 Elsevier/Gold Standard (2007-10-22 16:49:20)

## 2021-01-28 NOTE — Progress Notes (Signed)
Subjective: Mindy King presents today for follow up. She has h/o neuropathy secondary to chemotherapy. She has c/o of painful porokeratotic lesion(s) b/l feet that limit ambulation. Aggravating factors include weightbearing with and without shoe gear. Pain for both is relieved with periodic professional debridement.   She is inquiring about treatment for her mycotic toenails on today's visit.  Allergies  Allergen Reactions   Ampicillin    Mometasone Furo-Formoterol Fum Diarrhea    Abdominal cramps     Objective: There were no vitals filed for this visit.  Pt 70 y.o. year old [place] female, thin build in NAD. AAO x 3.   Vascular Examination:  Capillary fill time to digits <3 seconds b/l. Palpable DP pulses b/l. Palpable PT pulses b/l. Pedal hair sparse b/l. Skin temperature gradient within normal limits b/l.  Dermatological Examination: Pedal skin with normal turgor, texture and tone bilaterally. No open wounds bilaterally. No interdigital macerations bilaterally. Toenails 1-5 adequate length. Porokeratotic lesion(s) L hallux, L 2nd toe, R hallux, R 2nd toe, submet head 1 right foot, submet head 4 right foot, submet head 5 left foot and submet head 5 right foot. No erythema, no edema, no drainage, no flocculence.  Musculoskeletal: Normal muscle strength 5/5 to all lower extremity muscle groups bilaterally, no pain crepitus or joint limitation noted with ROM b/l, bunion deformity noted b/l and hammertoes noted to the  2-5 bilaterally.  Neurological: Pt has subjective symptoms of neuropathy. Protective sensation intact 5/5 intact bilaterally with 10g monofilament b/l. Vibratory sensation intact b/l.  Assessment: 1. Onychomycosis   2. Porokeratosis   3. 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 -Painful porokeratotic lesion(s) L hallux, L 2nd toe, R hallux, R 2nd toe, submet head 1 right foot, submet head 4 right foot, submet  head 5 left foot, and submet head 5 right foot pared and enucleated with sterile scalpel blade without incident. Total number of lesions debrided=8. -Patient to report any pedal injuries to medical professional immediately. -Rx written for Ciclopirox Nail Lacquer 8% to be applied to affected toenails once daily for 48 weeks and removed once weekly with nail polish remover. -Patient/POA to call should there be question/concern in the interim.  Return in about 1 month (around 02/26/2021).

## 2021-01-29 ENCOUNTER — Ambulatory Visit: Payer: 59 | Admitting: Podiatry

## 2021-02-07 LAB — UNMAPPED LAB RESULTS
ABO RH Blood Type (HT): A POS
Antibody Screen (HT): NEGATIVE
Basophil # (HT): 0.1 10 3/uL (ref 0.0–0.2)
Basophil % (HT): 1 % (ref 0–3)
Eosinophil # (HT): 0.2 10 3/uL (ref 0.1–0.6)
Eosinophil % (HT): 3 % (ref 0–5)
Hematocrit (HT): 38 % (ref 35–47)
Hemoglobin (HGB) (HT): 11.8 g/dL — ABNORMAL LOW (ref 12.0–16.0)
Lymphocyte # (HT): 1.7 10 3/uL (ref 1.0–4.8)
Lymphocyte % (HT): 24 % (ref 15–45)
MCHC (HT): 31.5 g/dL (ref 31.0–37.5)
MCV (HT): 81 fL (ref 80–100)
Mean Corpuscular Hemoglobin (MCH) (HT): 25.4 pg — ABNORMAL LOW (ref 26.0–34.0)
Monocyte # (HT): 0.6 10 3/uL (ref 0.1–1.0)
Monocyte % (HT): 8 % (ref 0–15)
Neutrophil # (HT): 4.5 10 3/uL (ref 1.8–8.0)
Platelets (HT): 225 10 3/uL (ref 150–450)
RBC (HT): 4.64 10 6/uL (ref 3.80–5.20)
RDW (HT): 15.1 % (ref 9.0–15.2)
Seg Neut % (HT): 64 % (ref 45–75)
WBC (HT): 7 10 3/uL (ref 4.0–11.0)

## 2021-03-05 ENCOUNTER — Encounter: Payer: Self-pay | Admitting: Podiatry

## 2021-03-05 ENCOUNTER — Ambulatory Visit (INDEPENDENT_AMBULATORY_CARE_PROVIDER_SITE_OTHER): Payer: 59 | Admitting: Podiatry

## 2021-03-05 ENCOUNTER — Other Ambulatory Visit: Payer: Self-pay

## 2021-03-05 ENCOUNTER — Telehealth: Payer: Self-pay

## 2021-03-05 DIAGNOSIS — Q828 Other specified congenital malformations of skin: Secondary | ICD-10-CM | POA: Diagnosis not present

## 2021-03-05 DIAGNOSIS — G620 Drug-induced polyneuropathy: Secondary | ICD-10-CM | POA: Diagnosis not present

## 2021-03-05 DIAGNOSIS — M79671 Pain in right foot: Secondary | ICD-10-CM | POA: Diagnosis not present

## 2021-03-05 DIAGNOSIS — T451X5A Adverse effect of antineoplastic and immunosuppressive drugs, initial encounter: Secondary | ICD-10-CM

## 2021-03-05 DIAGNOSIS — M79672 Pain in left foot: Secondary | ICD-10-CM

## 2021-03-05 NOTE — Progress Notes (Signed)
  Subjective:  Patient ID: Mindy King, female    DOB: 1950-10-24,  MRN: [id number]  Mindy King presents to clinic today for at risk foot care with history of chemotherapy induced neuropathy  Patient states she called her insurance company and was told she could be seen as many times as she needs to for her painful porokeratotic lesions and calluses. She states she was told there was no required time interval between visits.  She also states she is borderline diabetic.  PCP is Berkley Harvey, NP , and last visit was 02/18/2021.  Allergies  Allergen Reactions   Ampicillin    Mometasone Furo-Formoterol Fum Diarrhea    Abdominal cramps    Review of Systems: Negative except as noted in the HPI. Objective:   Constitutional Mindy King is a pleasant 70 y.o. African American female, thin build, AAO x 3.   Vascular Capillary fill time to digits <3 seconds b/l lower extremities. Palpable pedal pulses b/l LE. Pedal hair sparse. Lower extremity skin temperature gradient within normal limits. No pain with calf compression b/l. No edema noted b/l lower extremities. No cyanosis or clubbing noted.  Neurologic Normal speech. Oriented to person, place, and time. Pt has subjective symptoms of neuropathy. Protective sensation intact 5/5 intact bilaterally with 10g monofilament b/l. Vibratory sensation intact b/l.  Dermatologic Pedal skin with normal turgor, texture and tone b/l lower extremities. No open wounds b/l lower extremities. No interdigital macerations b/l lower extremities. Toenails recently debrided. Porokeratotic lesion(s) L hallux, R hallux, R 2nd toe, submet head 1 right foot, submet head 4 right foot, and submet head 5 left foot. No erythema, no edema, no drainage, no fluctuance.  Orthopedic: Normal muscle strength 5/5 to all lower extremity muscle groups bilaterally. Hallux valgus with bunion deformity noted b/l lower extremities. Hammertoe(s) noted to the 2-5 bilaterally.   Radiographs:  None Assessment:   1. Porokeratosis   2. Pain in both feet   3. Peripheral neuropathy due to chemotherapy [facility])    Plan:  -Examined patient. -Patient to continue soft, supportive shoe gear daily. -Painful porokeratotic lesion(s) L hallux, L 2nd toe, R hallux, R 2nd toe, submet head 1 right foot, submet head 4 right foot, submet head 5 left foot, and submet head 5 right foot pared and enucleated with sterile scalpel blade without incident. Total number of lesions debrided=8. -Patient to report any pedal injuries to medical professional immediately. -Patient/POA to call should there be question/concern in the interim.  Return in about 1 month (around 04/05/2021).  Marzetta Board, DPM

## 2021-03-05 NOTE — Telephone Encounter (Signed)
LVM for pt to call back. Pt was seen in office today and patient states she called her insurance company and was told she could be seen as many times as she needs to for her painful porokeratotic lesions and calluses. She states she was told there was no required time interval between visits. After speaking with [facility] she stated that the pt is has 4 covered corn/callus appts approved per calendar year by her insurance and anything after those 4 appts would need prior approval to determine medical necessity. As of today the pt has completed 3 of the 4 office visits for the year.  Reference number for phone call with [place] is [id number].

## 2021-03-08 NOTE — Telephone Encounter (Signed)
Spoke with pt and she stated that she was told by [facility] that because she has neuropathy that she can come in as many times as needed to be seen for her feet. I informed the pt of what Luvenia Starch told me and let her know that the appt she has scheduled for 05/24/2021 would be her 4th visit for the year if she gets corn/callus trimmed at this appt. To ensure that she didn't have an issue with payment for future appts for the year, I let pt know that we would submit an authorization. Pt was appreciative of our efforts to make sure her care is covered.

## 2021-03-19 DIAGNOSIS — Z5181 Encounter for therapeutic drug level monitoring: Secondary | ICD-10-CM | POA: Insufficient documentation

## 2021-03-19 DIAGNOSIS — Z79899 Other long term (current) drug therapy: Secondary | ICD-10-CM | POA: Insufficient documentation

## 2021-04-12 DIAGNOSIS — R7303 Prediabetes: Secondary | ICD-10-CM | POA: Insufficient documentation

## 2021-04-27 LAB — UNMAPPED LAB RESULTS
Basophil # (HT): 0.1 10 3/uL (ref 0.0–0.2)
Basophil % (HT): 1 % (ref 0–3)
Eosinophil # (HT): 0.3 10 3/uL (ref 0.1–0.6)
Eosinophil % (HT): 3 % (ref 0–5)
Hematocrit (HT): 41 % (ref 35–47)
Hemoglobin (HGB) (HT): 13.6 g/dL (ref 12.0–16.0)
Lymphocyte # (HT): 2.9 10 3/uL (ref 1.0–4.8)
Lymphocyte % (HT): 35 % (ref 15–45)
MCHC (HT): 33 g/dL (ref 31.0–37.5)
MCV (HT): 81 fL (ref 80–100)
Mean Corpuscular Hemoglobin (MCH) (HT): 26.9 pg (ref 26.0–34.0)
Monocyte # (HT): 0.5 10 3/uL (ref 0.1–1.0)
Monocyte % (HT): 6 % (ref 0–15)
Neutrophil # (HT): 4.5 10 3/uL (ref 1.8–8.0)
Platelets (HT): 248 10 3/uL (ref 150–450)
RBC (HT): 5.06 10 6/uL (ref 3.80–5.20)
RDW (HT): 16.8 % — ABNORMAL HIGH (ref 9.0–15.2)
Seg Neut % (HT): 54 % (ref 45–75)
WBC (HT): 8.2 10 3/uL (ref 4.0–11.0)

## 2021-05-14 LAB — UNMAPPED LAB RESULTS
Hematocrit (HT): 43 % (ref 35–47)
Hemoglobin (HGB) (HT): 14 g/dL (ref 12.0–16.0)
MCHC (HT): 32.5 g/dL (ref 31.0–37.5)
MCV (HT): 81 fL (ref 80–100)
Mean Corpuscular Hemoglobin (MCH) (HT): 26.4 pg (ref 26.0–34.0)
Platelets (HT): 211 10 3/uL (ref 150–450)
RBC (HT): 5.3 10 6/uL — ABNORMAL HIGH (ref 3.80–5.20)
RDW (HT): 17 % — ABNORMAL HIGH (ref 9.0–15.2)
WBC (HT): 5.9 10 3/uL (ref 4.0–11.0)

## 2021-05-24 ENCOUNTER — Ambulatory Visit: Payer: 59 | Admitting: Podiatry

## 2021-05-31 ENCOUNTER — Ambulatory Visit: Payer: 59 | Admitting: Podiatry

## 2021-08-26 ENCOUNTER — Other Ambulatory Visit: Payer: Self-pay | Admitting: Otolaryngology

## 2021-08-26 DIAGNOSIS — E042 Nontoxic multinodular goiter: Secondary | ICD-10-CM

## 2021-08-31 ENCOUNTER — Other Ambulatory Visit: Payer: Self-pay

## 2021-08-31 ENCOUNTER — Ambulatory Visit (INDEPENDENT_AMBULATORY_CARE_PROVIDER_SITE_OTHER): Payer: 59 | Admitting: Podiatry

## 2021-08-31 ENCOUNTER — Encounter: Payer: Self-pay | Admitting: Podiatry

## 2021-08-31 DIAGNOSIS — G620 Drug-induced polyneuropathy: Secondary | ICD-10-CM

## 2021-08-31 DIAGNOSIS — M79671 Pain in right foot: Secondary | ICD-10-CM | POA: Diagnosis not present

## 2021-08-31 DIAGNOSIS — Q828 Other specified congenital malformations of skin: Secondary | ICD-10-CM | POA: Diagnosis not present

## 2021-08-31 DIAGNOSIS — M79672 Pain in left foot: Secondary | ICD-10-CM

## 2021-09-05 NOTE — Progress Notes (Signed)
Subjective: Mindy King is a 71 y.o. female patient seen today for follow up of  at risk foot care with history of chemotherapy induced neuropathy and painful porokeratotic lesions of both feet.  Pain prevent comfortable ambulation. Aggravating factor is weightbearing with or without shoegear..   New problem(s)/concern(s) today: None    PCP is Berkley Harvey, NP. Last visit was: 06/18/2021.  Allergies  Allergen Reactions   Ampicillin    Mometasone Furo-Formoterol Fum Diarrhea    Abdominal cramps    Objective: Physical Exam  General: Patient is a pleasant 71 y.o. African American female thin build in NAD. AAO x 3.   Neurovascular Examination: CFT <3 seconds b/l LE. Palpable DP/PT pulses b/l LE. Digital hair absent b/l. Skin temperature gradient WNL b/l. No pain with calf compression b/l. No edema noted b/l. No cyanosis or clubbing noted b/l LE.  Pt has subjective symptoms of neuropathy. Protective sensation intact 5/5 intact bilaterally with 10g monofilament b/l. Vibratory sensation intact b/l.  Dermatological:  Pedal integument with normal turgor, texture and tone BLE. No open wounds b/l LE. No interdigital macerations noted b/l LE. Toenails recently debrided. Porokeratotic lesion(s) bilateral great toes, L 3rd toe, L 4th toe, L 5th toe, R 2nd toe, submet head 1 right foot, submet head 4 right foot, and submet head 5 left foot. No erythema, no edema, no drainage, no fluctuance.  Musculoskeletal:  HAV with bunion deformity noted b/l LE. Hammertoe deformity noted 2-5 b/l. Patient ambulates independent of any assistive aids.  Assessment: 1. Porokeratosis   2. Pain in both feet   3. Peripheral neuropathy due to chemotherapy [facility])    Plan: Patient was evaluated and treated and all questions answered. Consent given for treatment as described below: -Painful porokeratotic lesion(s) bilateral great toes, L 3rd toe, L 4th toe, L 5th toe, R 2nd toe, submet head 1 right foot, submet head  4 right foot, and submet head 5 left foot pared and enucleated with sterile scalpel blade without incident. Total number of lesions debrided=9. -Patient/POA to call should there be question/concern in the interim.  Return in about 6 weeks (around 10/12/2021).  Marzetta Board, DPM

## 2021-09-27 ENCOUNTER — Ambulatory Visit
Admission: RE | Admit: 2021-09-27 | Discharge: 2021-09-27 | Disposition: A | Discharge status: Home / Self Care | Payer: 59 | Source: Ambulatory Visit | Attending: Otolaryngology | Admitting: Otolaryngology

## 2021-09-27 DIAGNOSIS — E042 Nontoxic multinodular goiter: Secondary | ICD-10-CM

## 2021-10-18 ENCOUNTER — Other Ambulatory Visit: Payer: Self-pay

## 2021-10-18 ENCOUNTER — Ambulatory Visit (INDEPENDENT_AMBULATORY_CARE_PROVIDER_SITE_OTHER): Payer: 59 | Admitting: Podiatry

## 2021-10-18 ENCOUNTER — Encounter: Payer: Self-pay | Admitting: Podiatry

## 2021-10-18 ENCOUNTER — Ambulatory Visit: Payer: 59 | Admitting: Podiatry

## 2021-10-18 DIAGNOSIS — G620 Drug-induced polyneuropathy: Secondary | ICD-10-CM

## 2021-10-18 DIAGNOSIS — M79671 Pain in right foot: Secondary | ICD-10-CM

## 2021-10-18 DIAGNOSIS — Q828 Other specified congenital malformations of skin: Secondary | ICD-10-CM | POA: Diagnosis not present

## 2021-10-18 DIAGNOSIS — M79672 Pain in left foot: Secondary | ICD-10-CM | POA: Diagnosis not present

## 2021-10-18 DIAGNOSIS — T451X5A Adverse effect of antineoplastic and immunosuppressive drugs, initial encounter: Secondary | ICD-10-CM

## 2021-10-18 NOTE — Progress Notes (Addendum)
This patient returns to my office for at risk foot care.  This patient requires this care by a professional since this patient will be at risk due to having peripheral neuropathy due to cancer treatment. ?This patient is unable to cut corns and calluses herself since the patient cannot reach her nails.These nails are painful walking and wearing shoes.  This patient presents for at risk foot care today. ? ?General Appearance  Alert, conversant and in no acute stress. ? ?Vascular  Dorsalis pedis and posterior tibial  pulses are palpable  bilaterally.  Capillary return is within normal limits  bilaterally. Temperature is within normal limits  bilaterally. ? ?Neurologic  Senn-Weinstein monofilament wire test within normal limits  bilaterally. Muscle power within normal limits bilaterally. ? ?Nails Normal nails   from hallux to fifth toes bilaterally. No evidence of bacterial infection or drainage bilaterally. ? ?Orthopedic  No limitations of motion  feet .  No crepitus or effusions noted.  No bony pathology or digital deformities noted. ? ?Skin  normotropic skin with no porokeratosis noted bilaterally.  No signs of infections or ulcers noted.   Multiple callus forefeet  B/L.  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 ? ?Corns/Callus   Pain in right toes  Pain in left toes  Neuropathy ? ?Consent was obtained for treatment procedures.   Mechanical debridement of callus /corns  both feet  debrided with # 15 blade.  Filed with dremel without incident. Told patient to make an appointment with pedorthist for dispersion padding on her orthoses.  Make an appointment with Aaron Edelman. ? ? ?Return office visit    6  weeks                 Told patient to return for periodic foot care and evaluation due to potential at risk complications. ? ? ?Gardiner Barefoot DPM   ?

## 2021-10-27 ENCOUNTER — Other Ambulatory Visit: Payer: 59

## 2021-11-20 LAB — UNMAPPED LAB RESULTS
Basophil # (HT): 0.1 10 3/uL (ref 0.0–0.2)
Basophil % (HT): 1 % (ref 0–3)
Eosinophil # (HT): 0.4 10 3/uL (ref 0.1–0.6)
Eosinophil % (HT): 5 % (ref 0–5)
Hematocrit (HT): 41 % (ref 35–47)
Hemoglobin (HGB) (HT): 13.8 g/dL (ref 12.0–16.0)
Lymphocyte # (HT): 2 10 3/uL (ref 1.0–4.8)
Lymphocyte % (HT): 30 % (ref 15–45)
MCHC (HT): 33.5 g/dL (ref 31.0–37.5)
MCV (HT): 86 fL (ref 80–100)
Mean Corpuscular Hemoglobin (MCH) (HT): 28.8 pg (ref 26.0–34.0)
Monocyte # (HT): 0.5 10 3/uL (ref 0.1–1.0)
Monocyte % (HT): 7 % (ref 0–15)
Neutrophil # (HT): 3.8 10 3/uL (ref 1.8–8.0)
Platelets (HT): 207 10 3/uL (ref 150–450)
RBC (HT): 4.79 10 6/uL (ref 3.80–5.20)
RDW (HT): 13.8 % (ref 9.0–15.2)
Seg Neut % (HT): 56 % (ref 45–75)
WBC (HT): 6.7 10 3/uL (ref 4.0–11.0)

## 2021-11-30 ENCOUNTER — Ambulatory Visit (INDEPENDENT_AMBULATORY_CARE_PROVIDER_SITE_OTHER): Payer: 59 | Admitting: Podiatry

## 2021-11-30 ENCOUNTER — Encounter: Payer: Self-pay | Admitting: Podiatry

## 2021-11-30 ENCOUNTER — Ambulatory Visit: Payer: 59

## 2021-11-30 DIAGNOSIS — Q828 Other specified congenital malformations of skin: Secondary | ICD-10-CM

## 2021-11-30 DIAGNOSIS — G620 Drug-induced polyneuropathy: Secondary | ICD-10-CM | POA: Diagnosis not present

## 2021-11-30 DIAGNOSIS — L84 Corns and callosities: Secondary | ICD-10-CM

## 2021-11-30 NOTE — Progress Notes (Signed)
?  Subjective:  ?Patient ID: Mindy King, female    DOB: [birth year],  MRN: [id number] ? ?Mindy King presents to clinic today for  h/o chemotherapy induced neuropathy and painful porokeratotic lesions b/l lower extremities. Pain prevent(s) comfortable ambulation. Aggravating factor is weightbearing with and without shoegear. ? ?Patient states her cancer has progressed. 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  She states she has been trying to gain weight, but has been unsuccessful.  ? ?New problem(s):   She has developed new interdigital corn on lateral aspect of left 2nd toe she would like addressed ? ?PCP is Berkley Harvey, NP , and last visit was June 18, 2021. ? ?Allergies  ?Allergen Reactions  ? Ampicillin   ? Mometasone Furo-Formoterol Fum Diarrhea  ?  Abdominal cramps  ? ? ?Review of Systems: Negative except as noted in the HPI. ? ?Objective: No changes noted in today's physical examination. ?General: Patient is a pleasant 71 y.o. African American female thin build in NAD. AAO x 3.  ? ?Neurovascular Examination: ?CFT <3 seconds b/l LE. Palpable DP/PT pulses b/l LE. Digital hair absent b/l. Skin temperature gradient WNL b/l. No pain with calf compression b/l. No edema noted b/l. No cyanosis or clubbing noted b/l LE. ? ?Pt has subjective symptoms of neuropathy. Protective sensation intact 5/5 intact bilaterally with 10g monofilament b/l. Vibratory sensation intact b/l. ? ?Dermatological:  ?Pedal integument with normal turgor, texture and tone BLE. No open wounds b/l LE. No interdigital macerations noted b/l LE. Toenails recently debrided. Porokeratotic lesion(s) bilateral great toes, L 3rd toe, L 4th toe, L 5th toe, B 2nd toe, submet head 1 right foot, submet head 4 right foot, and submet head 5 left foot. No erythema, no edema, no drainage, no fluctuance. ? ?Musculoskeletal:  ?HAV with bunion deformity noted b/l LE. Hammertoe deformity noted 2-5 b/l. Patient ambulates independent of any assistive  aids. ? ?Assessment/Plan: ?1. Porokeratosis   ?2. Peripheral neuropathy due to chemotherapy [facility])   ?  ?-Patient was evaluated and treated. All patient's and/or POA's questions/concerns answered on today's visit. ?-Painful porokeratotic lesion(s) as noted above pared and enucleated with sterile scalpel blade without incident. Total number of lesions debrided=10. ?-Patient/POA to call should there be question/concern in the interim.  ? ?Return in about 6 weeks (around 01/11/2022). ? ?Marzetta Board, DPM  ?

## 2021-11-30 NOTE — Progress Notes (Signed)
SITUATION ?Reason for Consult: Follow-up with custom foot orthotics ?Patient / Caregiver Report: Patient did not present with her foot orthotics today for follow up ? ?OBJECTIVE DATA ?History / Diagnosis:  ?  ICD-10-CM   ?1. Peripheral neuropathy due to chemotherapy ([place])  G62.0   ? T45.1X5A   ?  ?2. Corns and callosities  L84   ?  ? ? ?Change in Pathology: None ? ?ACTIONS PERFORMED ?Discussed new foot orthotics vs refurbishing her old ones. Patient will reschedule for another f/u appointment.  All questions answered and concerns addressed. ? ?PLAN ?Follow-up as patient has scheduled in coordination with Dr. Elisha Ponder. Plan of care discussed with and agreed upon by patient / caregiver. ? ?

## 2021-12-28 LAB — UNMAPPED LAB RESULTS
Hematocrit (HT): 40 % (ref 35–47)
Hemoglobin (HGB) (HT): 13.5 g/dL (ref 12.0–16.0)
MCHC (HT): 33.5 g/dL (ref 31.0–37.5)
MCV (HT): 87 fL (ref 80–100)
Mean Corpuscular Hemoglobin (MCH) (HT): 29.1 pg (ref 26.0–34.0)
Platelets (HT): 225 10 3/uL (ref 150–450)
RBC (HT): 4.64 10 6/uL (ref 3.80–5.20)
RDW (HT): 14.1 % (ref 9.0–15.2)
WBC (HT): 7.1 10 3/uL (ref 4.0–11.0)

## 2021-12-31 IMAGING — DX DG CHEST 1V PORT
1 series · 1 of 1 positions shown · non-contrast
Comparison: CT chest 02/13/2020

CLINICAL DATA: Cough and fatigue.  History of lung cancer

EXAM:
PORTABLE CHEST 1 VIEW

[chest ap]
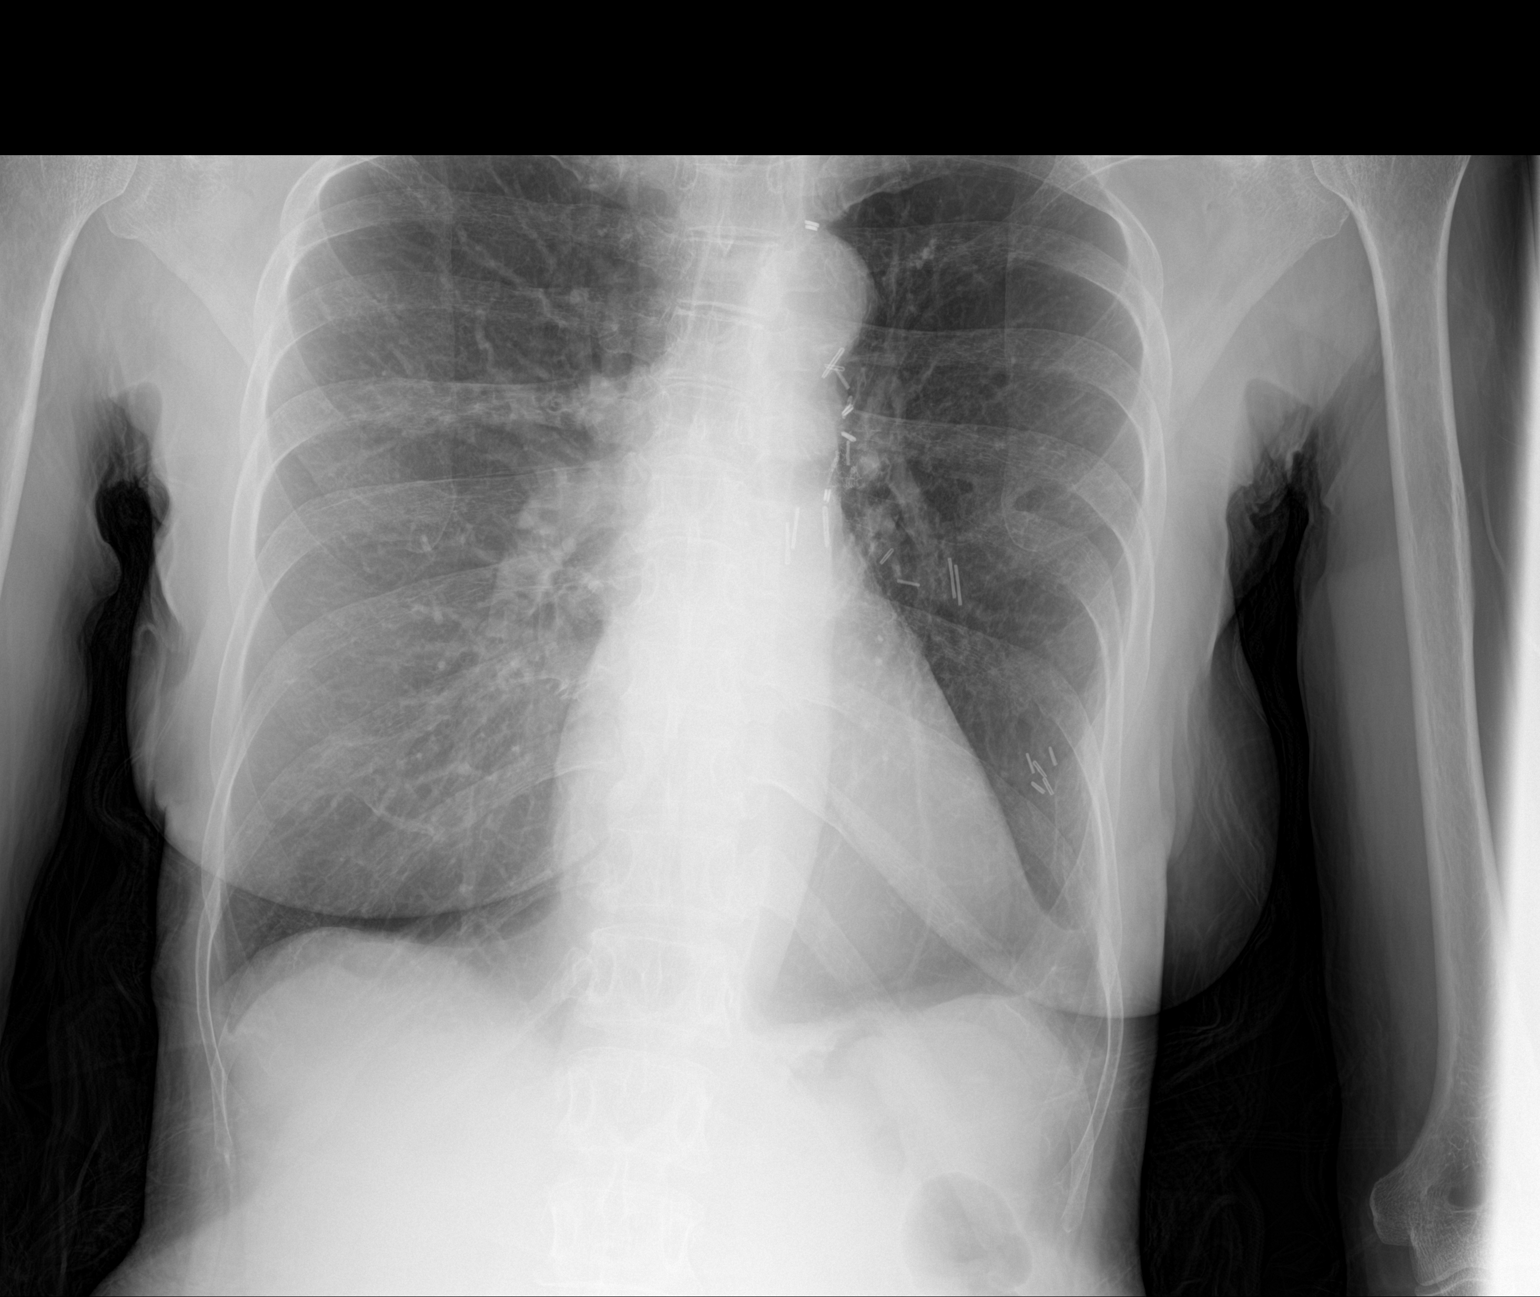

[1 of 1 positions shown; findings below may reference images not displayed]

FINDINGS: Surgical clips in the left hilum and left lung from prior lobectomy.
No recurrent mass or adenopathy. Lungs are well aerated and clear
without infiltrate or effusion. Heart size and vascularity normal.
IMPRESSION: Postop lobectomy on the left. No acute abnormality no recurrent mass
lesion.

## 2022-01-11 ENCOUNTER — Encounter: Payer: Self-pay | Admitting: Podiatry

## 2022-01-11 ENCOUNTER — Ambulatory Visit: Payer: 59

## 2022-01-11 ENCOUNTER — Ambulatory Visit (INDEPENDENT_AMBULATORY_CARE_PROVIDER_SITE_OTHER): Payer: 59 | Admitting: Podiatry

## 2022-01-11 DIAGNOSIS — T451X5A Adverse effect of antineoplastic and immunosuppressive drugs, initial encounter: Secondary | ICD-10-CM

## 2022-01-11 DIAGNOSIS — G620 Drug-induced polyneuropathy: Secondary | ICD-10-CM | POA: Diagnosis not present

## 2022-01-11 DIAGNOSIS — M79674 Pain in right toe(s): Secondary | ICD-10-CM

## 2022-01-11 DIAGNOSIS — B351 Tinea unguium: Secondary | ICD-10-CM

## 2022-01-11 DIAGNOSIS — Q828 Other specified congenital malformations of skin: Secondary | ICD-10-CM | POA: Diagnosis not present

## 2022-01-11 DIAGNOSIS — L84 Corns and callosities: Secondary | ICD-10-CM

## 2022-01-11 DIAGNOSIS — M79675 Pain in left toe(s): Secondary | ICD-10-CM

## 2022-01-11 NOTE — Progress Notes (Signed)
SITUATION Reason for Consult: Follow-up with foot orthotics Patient / Caregiver Report: Patient's existing foot orthotics work well but do not fit her shoes  OBJECTIVE DATA History / Diagnosis:    ICD-10-CM   1. Peripheral neuropathy due to chemotherapy ([place])  G62.0    T45.1X5A     2. Corns and callosities  L84       Change in Pathology: None  ACTIONS PERFORMED Patient's equipment was checked for structural stability and fit. Adjusted orthotics for a better shoe fit. Device(s) intact and fit is excellent. All questions answered and concerns addressed.  PLAN Follow-up as needed (PRN). Plan of care discussed with and agreed upon by patient / caregiver.

## 2022-01-17 NOTE — Progress Notes (Signed)
  Subjective:  Patient ID: Mindy King, female    DOB: November 05, 1950,  MRN: [id number]  Mindy King presents to clinic today for at risk foot care with h/o neuropathy secondary to chemotherapy and corn(s) b/l lower extremities, callus(es) b/l lower extremities and painful mycotic nails.  Pain interferes with ambulation. Aggravating factors include wearing enclosed shoe gear. Painful toenails interfere with ambulation. Aggravating factors include wearing enclosed shoe gear. Pain is relieved with periodic professional debridement. Painful corns and calluses are aggravated when weightbearing with and without shoegear. Pain is relieved with periodic professional debridement.  Patient states her lung cancer has progressed and she will be moving in with her daughter to have more family support.  New problem(s): None.   PCP is Berkley Harvey, NP , and last visit was Dec 23, 2021.  Allergies  Allergen Reactions   Ampicillin    Mometasone Furo-Formoterol Fum Diarrhea    Abdominal cramps    Review of Systems: Negative except as noted in the HPI.  Objective: No changes noted in today's physical examination. General: Patient is a pleasant 71 y.o. African American female thin build in NAD. AAO x 3.   Neurovascular Examination: CFT <3 seconds b/l LE. Palpable DP/PT pulses b/l LE. Digital hair absent b/l. Skin temperature gradient WNL b/l. No pain with calf compression b/l. No edema noted b/l. No cyanosis or clubbing noted b/l LE.  Pt has subjective symptoms of neuropathy. Protective sensation intact 5/5 intact bilaterally with 10g monofilament b/l. Vibratory sensation intact b/l.  Dermatological:  Pedal integument with normal turgor, texture and tone BLE. No open wounds b/l LE. No interdigital macerations noted b/l LE. Toenails left great toe, right 2nd toe are painful, elongated, discolored, dystrophic with subungual debris. Pain with dorsal palpation of nailplates. No erythema, no edema, no drainage  noted. Porokeratotic lesion(s) bilateral great toes, L 3rd toe, L 4th toe, L 5th toe, B 2nd toe, submet head 1 right foot, submet head 4 right foot, and submet head 5 left foot. No erythema, no edema, no drainage, no fluctuance.  Musculoskeletal:  HAV with bunion deformity noted b/l LE. Hammertoe deformity noted 2-5 b/l. Patient ambulates independent of any assistive aids.  Assessment/Plan: 1. Onychomycosis   2. Corns   3. 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 All patient's and/or POA's questions/concerns answered on today's visit. -Medicaid ABN signed. Patient consents for services of paring of corn(s)/callus(es)/porokeratos(es) today. Copy in patient chart. -Mycotic toenails left great toe and R 2nd toe were debrided in length and girth with sterile nail nippers and dremel without iatrogenic bleeding. -Corn(s) bilateral great toes, bilateral 2nd toes, and 3-5 left foot pared utilizing sterile scalpel blade without complication or incident. Total number debrided=7. -Porokeratotic lesion(s) submet head 1 right foot, submet head 4 right foot, and submet head 5 left foot pared and enucleated with sterile scalpel blade without incident. Total number of lesions debrided=3. -Patient/POA to call should there be question/concern in the interim.   Return in about 9 weeks (around 03/15/2022).  Marzetta Board, DPM

## 2022-04-19 ENCOUNTER — Ambulatory Visit: Payer: 59 | Admitting: Podiatry

## 2022-05-09 ENCOUNTER — Emergency Department (HOSPITAL_COMMUNITY): Payer: 59

## 2022-05-09 ENCOUNTER — Other Ambulatory Visit: Payer: Self-pay

## 2022-05-09 ENCOUNTER — Encounter (HOSPITAL_COMMUNITY): Payer: Self-pay | Admitting: *Deleted

## 2022-05-09 ENCOUNTER — Emergency Department (HOSPITAL_COMMUNITY)
Admission: EM | Admit: 2022-05-09 | Discharge: 2022-05-10 | Disposition: A | Discharge status: Home / Self Care | Payer: 59 | Attending: Emergency Medicine | Admitting: Emergency Medicine

## 2022-05-09 DIAGNOSIS — J069 Acute upper respiratory infection, unspecified: Secondary | ICD-10-CM | POA: Insufficient documentation

## 2022-05-09 DIAGNOSIS — Z85118 Personal history of other malignant neoplasm of bronchus and lung: Secondary | ICD-10-CM | POA: Diagnosis not present

## 2022-05-09 DIAGNOSIS — U071 COVID-19: Secondary | ICD-10-CM | POA: Diagnosis not present

## 2022-05-09 DIAGNOSIS — R0602 Shortness of breath: Secondary | ICD-10-CM | POA: Diagnosis present

## 2022-05-09 LAB — CBC
HCT: 39.6 % (ref 36.0–46.0)
Hemoglobin: 13.2 g/dL (ref 12.0–15.0)
MCH: 29.9 pg (ref 26.0–34.0)
MCHC: 33.3 g/dL (ref 30.0–36.0)
MCV: 89.8 fL (ref 80.0–100.0)
Platelets: 255 10*3/uL (ref 150–400)
RBC: 4.41 MIL/uL (ref 3.87–5.11)
RDW: 14 % (ref 11.5–15.5)
WBC: 6 10*3/uL (ref 4.0–10.5)
nRBC: 0 % (ref 0.0–0.2)

## 2022-05-09 LAB — COMPREHENSIVE METABOLIC PANEL
ALT: 43 U/L (ref 0–44)
AST: 37 U/L (ref 15–41)
Albumin: 3.3 g/dL — ABNORMAL LOW (ref 3.5–5.0)
Alkaline Phosphatase: 61 U/L (ref 38–126)
Anion gap: 6 (ref 5–15)
BUN: 14 mg/dL (ref 8–23)
CO2: 27 mmol/L (ref 22–32)
Calcium: 8.6 mg/dL — ABNORMAL LOW (ref 8.9–10.3)
Chloride: 108 mmol/L (ref 98–111)
Creatinine, Ser: 0.9 mg/dL (ref 0.44–1.00)
GFR, Estimated: >60 mL/min (ref >60)
Glucose, Bld: 103 mg/dL — ABNORMAL HIGH (ref 70–99)
Potassium: 3.9 mmol/L (ref 3.5–5.1)
Sodium: 141 mmol/L (ref 135–145)
Total Bilirubin: 0.5 mg/dL (ref 0.3–1.2)
Total Protein: 6 g/dL — ABNORMAL LOW (ref 6.5–8.1)

## 2022-05-09 NOTE — ED Triage Notes (Signed)
For about 5 days, she has not felt well, reporting she feels lethargic, dehydrated, cough, having trouble breathing. Reporting she has had a tactile temp, taking tylenol (last about 4 hours ago). Alert, lung sounds clear, no distress in triage.

## 2022-05-09 NOTE — ED Provider Triage Note (Signed)
Emergency Medicine Provider Triage Evaluation Note  Mindy King , a 71 y.o. female  was evaluated in triage.  Pt complains of fatigue, cough, diarrhea, and SOB for 5 days. Feels as though she has had a fever, been taking tylenol. States her symptoms feel how they did when she had COVID pneumonia  Review of Systems  Positive: As above Negative: CP, nausea, vomiting, abd pain  Physical Exam  BP 132/88 (BP Location: Right Arm)   Pulse 85   Temp 98.6 F (37 C) (Oral)   Resp 16   Ht 5\' 4"  (1.626 m)   Wt 44.9 kg   SpO2 95%   BMI 16.99 kg/m  Gen:   Awake, no distress   Resp:  Normal effort  MSK:   Moves extremities without difficulty  Other:    Medical Decision Making  Medically screening exam initiated at 7:44 PM.  Appropriate orders placed.  Mindy King was informed that the remainder of the evaluation will be completed by another provider, this initial triage assessment does not replace that evaluation, and the importance of remaining in the ED until their evaluation is complete.  Workup initiated   Mindy Plummer, PA-C 05/09/22 1945

## 2022-05-10 ENCOUNTER — Encounter (HOSPITAL_COMMUNITY): Payer: Self-pay | Admitting: Emergency Medicine

## 2022-05-10 DIAGNOSIS — U071 COVID-19: Secondary | ICD-10-CM | POA: Diagnosis not present

## 2022-05-10 MED ORDER — LACTATED RINGERS IV BOLUS
1000.0000 mL | Freq: Once | INTRAVENOUS | Status: AC
  Administered 2022-05-10: 1000 mL via INTRAVENOUS

## 2022-05-10 MED ORDER — LACTATED RINGERS IV SOLN: INTRAVENOUS | Status: DC

## 2022-05-10 NOTE — ED Provider Notes (Signed)
Patient seen after prior ED provider.  ED Work-up is without evidence of significant acute pathology.  Patient feels improved after ED evaluation.  She now desires discharge home.  She does understand need for close outpatient follow-up.  Strict return precautions given and understood.  All patient's questions answered to her satisfaction.   Valarie Merino, MD 05/10/22 [phone]

## 2022-05-10 NOTE — ED Provider Notes (Signed)
[facility] EMERGENCY DEPARTMENT Provider Note   CSN: [id number] Arrival date & time: 05/09/22  1918     History  No chief complaint on file.   Mindy King is a 71 y.o. female.  71 year old female with history of lung cancer presents with 5 days of URI symptoms.  Has had mild cough and shortness of breath.  Some watery diarrhea.  She endorses subjective fever.  She has not medicated with Tylenol with some relief.  History of COVID-pneumonia in the past and this feels similar.       Home Medications Prior to Admission medications   Medication Sig Start Date End Date Taking? Authorizing Provider  acetaminophen (TYLENOL) 500 MG tablet Take 1,000 mg by mouth every 6 (six) hours as needed for fever.    [provider]  albuterol (VENTOLIN HFA) 108 (90 Base) MCG/ACT inhaler Inhale into the lungs. 02/18/21   [provider]  Ascorbic Acid 500 MG CHEW Chew by mouth.    [provider]  atorvastatin (LIPITOR) 20 MG tablet Take 1 tablet by mouth daily. 08/25/20   [provider]  azelastine (ASTELIN) 0.1 % nasal spray Place 1 spray into both nostrils daily as needed for rhinitis. 03/29/18   [provider]  B Complex-C (B-COMPLEX WITH VITAMIN C) tablet Take 1 tablet by mouth daily.    [provider]  Cholecalciferol 25 MCG (1000 UT) tablet Take 1,000 Units by mouth daily. 04/18/14   [provider]  ciclopirox (PENLAC) 8 % solution Apply one coat to each toenail daily for 48 weeks. Remove every 7 days with polish remover. 01/27/21   Marzetta Board, DPM  cyanocobalamin 100 MCG tablet Take 1 tablet by mouth daily. 08/25/20   [provider]  denosumab (PROLIA) 60 MG/ML SOSY injection Inject 60 mg into the skin every 6 (six) months. 03/28/18   [provider]  montelukast (SINGULAIR) 10 MG tablet Take 10 mg by mouth at bedtime.    [provider]  Multiple Vitamin (DAILY-VITE) TABS Take 1  tablet by mouth daily. 08/28/18   [provider]  omeprazole (PRILOSEC) 20 MG capsule Take 20 mg by mouth daily. 11/08/21   [provider]  sertraline (ZOLOFT) 50 MG tablet Take 50 mg by mouth every morning. 08/25/20   [provider]  SPIRIVA HANDIHALER 18 MCG inhalation capsule Place 1 capsule into inhaler and inhale daily. 05/22/18   [provider]  tetracycline (SUMYCIN) 500 MG capsule Take 500 mg by mouth 4 (four) times daily. 12/10/20   [provider]  valACYclovir (VALTREX) 1000 MG tablet Take one po daily x 30 days, then use one po x 5 days as needed for recurrent outbreaks. 02/18/21   [provider]  vitamin A 3 MG (10000 UNITS) capsule Take by mouth.    [provider]  vitamin E 45 MG (100 UNITS) capsule Take by mouth.    [provider]      Allergies    Ampicillin and Mometasone furo-formoterol fum    Review of Systems   Review of Systems  All other systems reviewed and are negative.   Physical Exam Updated Vital Signs BP 116/75 (BP Location: Right Arm)   Pulse 83   Temp 98.6 F (37 C) (Oral)   Resp 16   Ht 1.626 m (5\' 4" )   Wt 44.9 kg   SpO2 92%   BMI 16.99 kg/m  Physical Exam Vitals and nursing note reviewed.  Constitutional:  General: She is not in acute distress.    Appearance: Normal appearance. She is well-developed. She is not toxic-appearing.  HENT:     Head: Normocephalic and atraumatic.  Eyes:     General: Lids are normal.     Conjunctiva/sclera: Conjunctivae normal.     Pupils: Pupils are equal, round, and reactive to light.  Neck:     Thyroid: No thyroid mass.     Trachea: No tracheal deviation.  Cardiovascular:     Rate and Rhythm: Normal rate and regular rhythm.     Heart sounds: Normal heart sounds. No murmur heard.    No gallop.  Pulmonary:     Effort: Pulmonary effort is normal. No respiratory distress.     Breath sounds: Normal breath sounds. No stridor. No  decreased breath sounds, wheezing, rhonchi or rales.  Abdominal:     General: There is no distension.     Palpations: Abdomen is soft.     Tenderness: There is no abdominal tenderness. There is no rebound.  Musculoskeletal:        General: No tenderness. Normal range of motion.     Cervical back: Normal range of motion and neck supple.  Skin:    General: Skin is warm and dry.     Findings: No abrasion or rash.  Neurological:     Mental Status: She is alert and oriented to person, place, and time. Mental status is at baseline.     GCS: GCS eye subscore is 4. GCS verbal subscore is 5. GCS motor subscore is 6.     Cranial Nerves: No cranial nerve deficit.     Sensory: No sensory deficit.     Motor: Motor function is intact.  Psychiatric:        Attention and Perception: Attention normal.        Speech: Speech normal.        Behavior: Behavior normal.     ED Results / Procedures / Treatments   Labs (all labs ordered are listed, but only abnormal results are displayed) Labs Reviewed  COMPREHENSIVE METABOLIC PANEL - Abnormal; Notable for the following components:      Result Value   Glucose, Bld 103 (*)    Calcium 8.6 (*)    Total Protein 6.0 (*)    Albumin 3.3 (*)    All other components within normal limits  RESP PANEL BY RT-PCR (FLU A&B, COVID) ARPGX2  SARS CORONAVIRUS 2 BY RT PCR  CBC    EKG None  Radiology DG Chest 2 View  Result Date: 05/09/2022 CLINICAL DATA:  Shortness of breath EXAM: CHEST - 2 VIEW COMPARISON:  CT chest dated 02/02/2022 FINDINGS: Patient is status post left upper lobectomy in correlating with prior CT. Lungs are clear.  No pleural effusion or pneumothorax. Heart is normal in size. Visualized osseous structures are within normal limits. IMPRESSION: Status post left upper lobectomy. No evidence of acute cardiopulmonary disease. Electronically Signed   By: Julian Hy M.D.   On: 05/09/2022 22:14    Procedures Procedures    Medications Ordered  in ED Medications  lactated ringers bolus 1,000 mL (has no administration in time range)  lactated ringers infusion (has no administration in time range)    ED Course/ Medical Decision Making/ A&P                           Medical Decision Making Risk Prescription drug management.   Patient given IV  fluids here for possible dehydration.  Chest x-ray per my interpretation shows no evidence of acute pulmonary disease.  Patient likely viral illness.  COVID test is pending at this time.  She is hemodynamically stable.  Not hypoxic.  Do not think that she has a PE.  Plan will be to check COVID test and likely discharge.  Signed to next provider        Final Clinical Impression(s) / ED Diagnoses Final diagnoses:  None    Rx / DC Orders ED Discharge Orders     None         Lacretia Leigh, MD 05/10/22 1521

## 2022-05-10 NOTE — Discharge Instructions (Addendum)
Return for any problem.  ?

## 2022-05-11 ENCOUNTER — Encounter: Payer: Self-pay | Admitting: Podiatry

## 2022-05-11 ENCOUNTER — Telehealth (HOSPITAL_COMMUNITY): Payer: Self-pay

## 2022-05-11 ENCOUNTER — Ambulatory Visit (INDEPENDENT_AMBULATORY_CARE_PROVIDER_SITE_OTHER): Payer: 59 | Admitting: Podiatry

## 2022-05-11 DIAGNOSIS — M79672 Pain in left foot: Secondary | ICD-10-CM

## 2022-05-11 DIAGNOSIS — L84 Corns and callosities: Secondary | ICD-10-CM

## 2022-05-11 DIAGNOSIS — Q828 Other specified congenital malformations of skin: Secondary | ICD-10-CM

## 2022-05-11 DIAGNOSIS — T451X5A Adverse effect of antineoplastic and immunosuppressive drugs, initial encounter: Secondary | ICD-10-CM

## 2022-05-11 DIAGNOSIS — M79671 Pain in right foot: Secondary | ICD-10-CM

## 2022-05-11 DIAGNOSIS — G620 Drug-induced polyneuropathy: Secondary | ICD-10-CM

## 2022-05-11 DIAGNOSIS — B351 Tinea unguium: Secondary | ICD-10-CM

## 2022-05-11 LAB — RESP PANEL BY RT-PCR (FLU A&B, COVID) ARPGX2
Influenza A by PCR: NEGATIVE
Influenza B by PCR: NEGATIVE
SARS Coronavirus 2 by RT PCR: NEGATIVE

## 2022-05-11 LAB — SARS CORONAVIRUS 2 BY RT PCR: SARS Coronavirus 2 by RT PCR: POSITIVE — AB

## 2022-05-11 NOTE — Progress Notes (Signed)
  Subjective:  Patient ID: Courtenay Hirth, female    DOB: Jan 23, 1951,  MRN: [id number]  Lavergne Hiltunen presents to clinic today for:  Chief Complaint  Patient presents with   Nail Problem    Routine foot care PCP-Penny Jones PCP VST-03/25/2022   New problem(s): None.   PCP is Berkley Harvey, NP , and last visit was  March 25, 2022.  Allergies  Allergen Reactions   Ampicillin    Mometasone Furo-Formoterol Fum Diarrhea    Abdominal cramps   Review of Systems: Negative except as noted in the HPI.  Objective: No changes noted in today's physical examination.  Florrie Ramires is a pleasant 71 y.o. female thin build in NAD. AAO x 3. Neurovascular Examination: CFT <3 seconds b/l LE. Palpable DP/PT pulses b/l LE. Digital hair absent b/l. Skin temperature gradient WNL b/l. No pain with calf compression b/l. No edema noted b/l. No cyanosis or clubbing noted b/l LE.  Pt has subjective symptoms of neuropathy. Protective sensation intact 5/5 intact bilaterally with 10g monofilament b/l. Vibratory sensation intact b/l.  Dermatological:  Pedal integument with normal turgor, texture and tone BLE. No open wounds b/l LE. No interdigital macerations noted b/l LE.   Toenails left great toe, right 2nd toe are painful, elongated, discolored, dystrophic with subungual debris. Pain with dorsal palpation of nailplates. No erythema, no edema, no drainage noted.   Porokeratotic lesion(s) bilateral great toes, L 3rd toe, L 4th toe, L 5th toe, B 2nd toe, submet head 1 right foot, submet head 4 right foot, and submet head 5 left foot. No erythema, no edema, no drainage, no fluctuance.  Musculoskeletal:  HAV with bunion deformity noted b/l LE. Hammertoe deformity noted 2-5 b/l. Patient ambulates independent of any assistive aids.  Assessment/Plan: 1. Onychomycosis   2. Corns and callosities   3. 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   -Consent given for treatment as described below: -Examined patient. -Medicaid ABN signed for services of paring of corn(s)/callus(es)/porokeratos(es) today. Copy in patient chart. -Mycotic toenails bilateral great toes were debrided in length and girth with sterile nail nippers and dremel without iatrogenic bleeding. -Corn(s) bilateral great toes, bilateral 2nd toes, and 3-5 left foot pared utilizing sharp debridement with sterile blade without complication or incident. Total number debrided=7. -Porokeratotic lesion(s) submet head 1 right foot, submet head 4 right foot, and submet head 5 left foot pared and enucleated with sterile currette without incident. Total number of lesions debrided=3. -Patient/POA to call should there be question/concern in the interim.   Return in about 12 weeks (around 08/03/2022).  Marzetta Board, DPM

## 2022-05-12 ENCOUNTER — Other Ambulatory Visit: Payer: Self-pay

## 2022-05-12 ENCOUNTER — Emergency Department (HOSPITAL_COMMUNITY): Payer: 59

## 2022-05-12 ENCOUNTER — Emergency Department (HOSPITAL_COMMUNITY)
Admission: EM | Admit: 2022-05-12 | Discharge: 2022-05-12 | Disposition: A | Discharge status: Home / Self Care | Payer: 59 | Attending: Emergency Medicine | Admitting: Emergency Medicine

## 2022-05-12 ENCOUNTER — Telehealth (HOSPITAL_COMMUNITY): Payer: Self-pay

## 2022-05-12 DIAGNOSIS — F17200 Nicotine dependence, unspecified, uncomplicated: Secondary | ICD-10-CM | POA: Diagnosis not present

## 2022-05-12 DIAGNOSIS — R6883 Chills (without fever): Secondary | ICD-10-CM | POA: Diagnosis present

## 2022-05-12 DIAGNOSIS — U071 COVID-19: Secondary | ICD-10-CM | POA: Insufficient documentation

## 2022-05-12 DIAGNOSIS — Z85118 Personal history of other malignant neoplasm of bronchus and lung: Secondary | ICD-10-CM | POA: Diagnosis not present

## 2022-05-12 LAB — CBC WITH DIFFERENTIAL/PLATELET
Abs Immature Granulocytes: 0.01 10*3/uL (ref 0.00–0.07)
Basophils Absolute: 0 10*3/uL (ref 0.0–0.1)
Basophils Relative: 1 %
Eosinophils Absolute: 0 10*3/uL (ref 0.0–0.5)
Eosinophils Relative: 0 %
HCT: 42 % (ref 36.0–46.0)
Hemoglobin: 13.2 g/dL (ref 12.0–15.0)
Immature Granulocytes: 0 %
Lymphocytes Relative: 55 %
Lymphs Abs: 2.8 10*3/uL (ref 0.7–4.0)
MCH: 29.1 pg (ref 26.0–34.0)
MCHC: 31.4 g/dL (ref 30.0–36.0)
MCV: 92.7 fL (ref 80.0–100.0)
Monocytes Absolute: 0.4 10*3/uL (ref 0.1–1.0)
Monocytes Relative: 8 %
Neutro Abs: 1.8 10*3/uL (ref 1.7–7.7)
Neutrophils Relative %: 36 %
Platelets: 208 10*3/uL (ref 150–400)
RBC: 4.53 MIL/uL (ref 3.87–5.11)
RDW: 14.5 % (ref 11.5–15.5)
WBC: 5.1 10*3/uL (ref 4.0–10.5)
nRBC: 0 % (ref 0.0–0.2)

## 2022-05-12 MED ORDER — PREDNISONE 10 MG PO TABS: 40.0000 mg | ORAL_TABLET | Freq: Every day | ORAL | 0 refills | Status: AC

## 2022-05-12 MED ORDER — PREDNISONE 20 MG PO TABS
60.0000 mg | ORAL_TABLET | Freq: Once | ORAL | Status: AC
  Administered 2022-05-12: 60 mg via ORAL
  Filled 2022-05-12: qty 3

## 2022-05-12 MED ORDER — ALBUTEROL SULFATE HFA 108 (90 BASE) MCG/ACT IN AERS
2.0000 | INHALATION_SPRAY | Freq: Once | RESPIRATORY_TRACT | Status: AC
  Administered 2022-05-12: 2 via RESPIRATORY_TRACT
  Filled 2022-05-12: qty 6.7

## 2022-05-12 MED ORDER — ONDANSETRON 4 MG PO TBDP: 4.0000 mg | ORAL_TABLET | Freq: Three times a day (TID) | ORAL | 0 refills | Status: AC | PRN

## 2022-05-12 MED ORDER — MOLNUPIRAVIR EUA 200MG CAPSULE: 4.0000 | ORAL_CAPSULE | Freq: Two times a day (BID) | ORAL | 0 refills | Status: AC

## 2022-05-12 MED ORDER — LACTATED RINGERS IV BOLUS: 1000.0000 mL | Freq: Once | INTRAVENOUS | Status: DC

## 2022-05-12 NOTE — ED Provider Triage Note (Signed)
Emergency Medicine Provider Triage Evaluation Note  Mindy King , a 71 y.o. female  was evaluated in triage.  Pt was seen in the ED on 10/9 and diagnosed with an upper respiratory infection.  Patient COVID test was negative but then she was recalled and told that there was an error and she is COVID-positive.  Patient has had COVID once before and had associated pneumonia and required antibiotics.  Patient is very concerned.  She reports she is having chills, fatigue and malaise.  Underlying history of lung cancer, not currently on chemotherapy.  Review of Systems  Positive: Chills, fatigue, malaise Negative: Chest pain, shortness of breath  Physical Exam  BP (!) 169/102 (BP Location: Right Arm)   Pulse 80   Temp 99.3 F (37.4 C)   Resp 20   SpO2 98%  Gen:   Awake, no distress   Resp:  Normal effort  MSK:   Moves extremities without difficulty  Other:    Medical Decision Making  Medically screening exam initiated at 11:15 AM.  Appropriate orders placed.  Mindy King was informed that the remainder of the evaluation will be completed by another provider, this initial triage assessment does not replace that evaluation, and the importance of remaining in the ED until their evaluation is complete.  I reviewed chest x-ray from 10/9, fortunately no pneumonia at this time, will repeat chest x-ray and get basic labs.  Patient is concerned about needing antibiotics, may also be a candidate for COVID antivirals.  Currently well-appearing   Mindy Larsen, PA-C 05/12/22 1125

## 2022-05-12 NOTE — Discharge Instructions (Addendum)
You did test positive for COVID a few days ago when you were last seen which is likely contributing to all of your current symptoms.  Thankfully, your lab work, physical exam and chest x-ray were reassuring as we discussed.  There was no evidence of pneumonia or fluid on the lungs.  You can take the steroids as prescribed for the next few days to help with inflammation and your underlying emphysema.  Continue to use your albuterol inhaler for wheezing or shortness of breath.  Take Tylenol and ibuprofen as needed for fever or pain.  And take the antiviral medication as prescribed to treat COVID.  This will help reduce your risk of getting worse or being hospitalized.  However, as we discussed it does come with many side effects including mainly abdominal upset and nausea.  I have prescribed you Zofran to take as needed for nausea or vomiting.  Follow-up with your home doctor regarding your visit to the ER today.  Come back to the ER for any new severe chest pain, worsening shortness of breath, fainting, inability to keep down any food or fluids, or any other symptoms concerning to you.

## 2022-05-12 NOTE — ED Notes (Signed)
All discharge instructions including follow up care and prescriptions reviewed with patient and patient verbalized understanding of same. Patient stable and ambulatory at time of discharge.  

## 2022-05-12 NOTE — Telephone Encounter (Signed)
Contacted pt and 2 identifiers used.  Informed her of results.  She was aware of results and stated they were negative and she received a call last night and was informed they were +.  She would like to come back to the ED and not wait for a room.  Explained to pt. That she will need to go to triage first and they would evaluate her and would get her back as soon as they can.  Spoke with Triage RN and PA and informed them that patient would be returning.

## 2022-05-12 NOTE — ED Triage Notes (Signed)
Pt. Stated, I was called to come back due to have COVID and it was positive and not negative. I was called back so here I am. I do have the same symptoms and I have lung cancer.

## 2022-05-12 NOTE — ED Provider Notes (Signed)
[facility] EMERGENCY DEPARTMENT Provider Note   CSN: [id number] Arrival date & time: 05/12/22  1018     History  Chief Complaint  Patient presents with   Chills   Fatigue    Mindy King is a 71 y.o. female.  With history of lung cancer not on any active chemotherapy or radiation, lung nodules, smoker who re- presents to the ER after having an addended COVID test that was initially negative now positive from 05/10/2022 advised to re- present.  Patient presents upset because she was called to review presented to the ER after her COVID test was changed to positive advising her to come back to the ER for reevaluation.  She was told she was going to come back immediately and see a doctor but waited for a while in the waiting room.  She has had generally all the same symptoms with ongoing almost 1 week now of generalized malaise, intermittent chills, cough and congestion and rhinorrhea or productive of clear sputum.  There has been no true fevers at home.  She has had no significant GI symptoms other than mild watery diarrhea.  She is still tolerating p.o.  She is intermittently taking Tylenol with some relief.  She has had no significant chest pain or shortness of breath.  She is concerned because she has history of lung cancer and does not want to die and thinks she may need to be admitted.  HPI     Home Medications Prior to Admission medications   Medication Sig Start Date End Date Taking? Authorizing Provider  acetaminophen (TYLENOL) 500 MG tablet Take 1,000 mg by mouth every 6 (six) hours as needed for fever.    [provider]  albuterol (VENTOLIN HFA) 108 (90 Base) MCG/ACT inhaler Inhale into the lungs. 02/18/21   [provider]  Ascorbic Acid 500 MG CHEW Chew by mouth.    [provider]  atorvastatin (LIPITOR) 20 MG tablet Take 1 tablet by mouth daily. 08/25/20   [provider]  azelastine (ASTELIN) 0.1 % nasal spray Place 1  spray into both nostrils daily as needed for rhinitis. 03/29/18   [provider]  B Complex-C (B-COMPLEX WITH VITAMIN C) tablet Take 1 tablet by mouth daily.    [provider]  Cholecalciferol 25 MCG (1000 UT) tablet Take 1,000 Units by mouth daily. 04/18/14   [provider]  ciclopirox (PENLAC) 8 % solution Apply one coat to each toenail daily for 48 weeks. Remove every 7 days with polish remover. 01/27/21   Marzetta Board, DPM  cyanocobalamin 100 MCG tablet Take 1 tablet by mouth daily. 08/25/20   [provider]  denosumab (PROLIA) 60 MG/ML SOSY injection Inject 60 mg into the skin every 6 (six) months. 03/28/18   [provider]  montelukast (SINGULAIR) 10 MG tablet Take 10 mg by mouth at bedtime.    [provider]  Multiple Vitamin (DAILY-VITE) TABS Take 1 tablet by mouth daily. 08/28/18   [provider]  omeprazole (PRILOSEC) 20 MG capsule Take 20 mg by mouth daily. 11/08/21   [provider]  sertraline (ZOLOFT) 50 MG tablet Take 50 mg by mouth every morning. 08/25/20   [provider]  SPIRIVA HANDIHALER 18 MCG inhalation capsule Place 1 capsule into inhaler and inhale daily. 05/22/18   [provider]  tetracycline (SUMYCIN) 500 MG capsule Take 500 mg by mouth 4 (four) times daily. 12/10/20   [provider]  valACYclovir (VALTREX) 1000  MG tablet Take one po daily x 30 days, then use one po x 5 days as needed for recurrent outbreaks. 02/18/21   [provider]  vitamin A 3 MG (10000 UNITS) capsule Take by mouth.    [provider]  vitamin E 45 MG (100 UNITS) capsule Take by mouth.    [provider]      Allergies    Ampicillin and Mometasone furo-formoterol fum    Review of Systems   Review of Systems  Physical Exam Updated Vital Signs BP 114/75 (BP Location: Left Arm)   Pulse 73   Temp 98.5 F (36.9 C) (Oral)   Resp 19   SpO2 97%  Physical  Exam Constitutional: Alert and oriented. Well appearing and in no distress. Eyes: Conjunctivae are normal. ENT      Head: Normocephalic and atraumatic.      Nose: No congestion.      Mouth/Throat: Mucous membranes are moist.      Neck: No stridor. Cardiovascular: S1, S2,  Normal and symmetric distal pulses are present in all extremities.Warm and well perfused. Respiratory: Normal respiratory effort. Mild end expiratory wheeze mid lung, 100 % on RA Gastrointestinal: Soft and nontender. There is no CVA tenderness. Musculoskeletal: Normal range of motion in all extremities.      Right lower leg: No tenderness or edema.      Left lower leg: No tenderness or edema. Neurologic: Normal speech and language. No gross focal neurologic deficits are appreciated. Skin: Skin is warm, dry and intact. No rash noted. Psychiatric: Mood and affect are normal. Speech and behavior are normal.  ED Results / Procedures / Treatments   Labs (all labs ordered are listed, but only abnormal results are displayed) Labs Reviewed  CBC WITH DIFFERENTIAL/PLATELET    EKG None  Radiology DG Chest 2 View  Result Date: 05/12/2022 CLINICAL DATA:  COVID positive, history of lung cancer EXAM: CHEST - 2 VIEW COMPARISON:  Chest radiograph 05/09/2022 FINDINGS: The cardiomediastinal silhouette is stable. Surgical clips projecting over the left aspect of the mediastinum and left midlung reflecting history of left upper lobectomy are stable. There is unchanged hyperinflation with flattening of the diaphragms. There is no focal consolidation. There is no new or worsening focal airspace disease. There is no pulmonary edema. There is no pleural effusion or pneumothorax There is no acute osseous abnormality. IMPRESSION: Stable chest with no radiographic evidence of acute cardiopulmonary process. Electronically Signed   By: Valetta Mole M.D.   On: 05/12/2022 11:32    Procedures Procedures    Medications Ordered in  ED Medications  predniSONE (DELTASONE) tablet 60 mg (has no administration in time range)    ED Course/ Medical Decision Making/ A&P                           Medical Decision Making Mindy King is a 71 y.o. female.  With history of lung cancer not on any active chemotherapy or radiation, lung nodules, smoker who re- presents to the ER after having an addended COVID test that was initially negative now positive from 05/10/2022 advised to re- present.   Overall, this is a well appearing patient presenting with mild viral-like symptoms, known secondary to [place] from positive test 05/10/22.   Vitals as documented. On exam, normal work of breathing. Speaking in full sentences without difficulty. No respiratory distress and overall non-toxic appearing. No evidence of hypoxia.  Mild end expiratory wheeze at mid lung  given po prednisone and albuterol for underlying history of emphysema.  Based on the patient's medical screening exam, including their history, vitals, and physical exam, the patient is stable for further outpatient evaluation of their symptoms. There is no evidence of respiratory distress, systemic toxicity, hemodynamic compromise, or emergent medical condition.    CXR personally reviewed with patient with no evidence of consolidation concerning for superimposed bacterial pneumonia, no pleural effusion, no pneumothorax.  Provided much reassurance to the patient.  Regarding my thought process, this patient has signs and symptoms consistent with a viral syndrome. There is no evidence to suggest serious bacterial infection at this time, including bacteremia. There is no stridor or difficulty handling secretions to suggest a severe bacterial upper respiratory illness. Signs, symptoms and clinical appearance are not consistent with meningitis or encephalitis. No risk factors for bacterial endocarditis (like IVDU, indwelling catheter, prosthetic heart valve, ESRD), and no known history of  valvular abnormality. At this time I do not believe this to be pericarditis or myocarditis given predominant respiratory symptoms and no signs of fluid overload.   Results and decision making discussed in depth with the patient. I discussed plan to discharge the patient and the important need for follow up. They agree with plan. I discussed strict return precautions with them, which were included in my discharge instructions, and they understand and agree to come back to the Emergency Department if things change/worsen. They express understanding, and patient with discharged in stable condition with Rx for prednisone and molnupiravir with prn zofran.   Risk Prescription drug management.   Final Clinical Impression(s) / ED Diagnoses Final diagnoses:  None    Rx / DC Orders ED Discharge Orders     None         Elgie Congo, MD 05/12/22 2204

## 2022-05-24 LAB — UNMAPPED LAB RESULTS
Basophil # (HT): 0.1 10 3/uL (ref 0.0–0.2)
Basophil % (HT): 1 % (ref 0–3)
Eosinophil # (HT): 0.3 10 3/uL (ref 0.1–0.6)
Eosinophil % (HT): 3 % (ref 0–5)
Hematocrit (HT): 46 % (ref 35–47)
Hemoglobin (HGB) (HT): 15.1 g/dL (ref 12.0–16.0)
Lymphocyte # (HT): 2.6 10 3/uL (ref 1.0–4.8)
Lymphocyte % (HT): 33 % (ref 15–45)
MCHC (HT): 32.5 g/dL (ref 31.0–37.5)
MCV (HT): 88 fL (ref 80–100)
Mean Corpuscular Hemoglobin (MCH) (HT): 28.5 pg (ref 26.0–34.0)
Monocyte # (HT): 0.5 10 3/uL (ref 0.1–1.0)
Monocyte % (HT): 6 % (ref 0–15)
Neutrophil # (HT): 4.4 10 3/uL (ref 1.8–8.0)
Platelets (HT): 259 10 3/uL (ref 150–450)
RBC (HT): 5.3 10 6/uL — ABNORMAL HIGH (ref 3.80–5.20)
RDW (HT): 13.8 % (ref 9.0–15.2)
Seg Neut % (HT): 56 % (ref 45–75)
WBC (HT): 7.9 10 3/uL (ref 4.0–11.0)

## 2022-07-17 ENCOUNTER — Other Ambulatory Visit: Payer: Self-pay | Admitting: Podiatry

## 2022-07-17 DIAGNOSIS — B351 Tinea unguium: Secondary | ICD-10-CM

## 2022-08-29 ENCOUNTER — Encounter: Payer: Self-pay | Admitting: Podiatry

## 2022-08-29 ENCOUNTER — Ambulatory Visit (INDEPENDENT_AMBULATORY_CARE_PROVIDER_SITE_OTHER): Payer: 59 | Admitting: Podiatry

## 2022-08-29 VITALS — BP 131/73

## 2022-08-29 DIAGNOSIS — G620 Drug-induced polyneuropathy: Secondary | ICD-10-CM

## 2022-08-29 DIAGNOSIS — B351 Tinea unguium: Secondary | ICD-10-CM | POA: Diagnosis not present

## 2022-08-29 DIAGNOSIS — Z789 Other specified health status: Secondary | ICD-10-CM | POA: Insufficient documentation

## 2022-08-29 DIAGNOSIS — T451X5A Adverse effect of antineoplastic and immunosuppressive drugs, initial encounter: Secondary | ICD-10-CM | POA: Diagnosis not present

## 2022-08-29 DIAGNOSIS — L84 Corns and callosities: Secondary | ICD-10-CM

## 2022-08-29 DIAGNOSIS — Q828 Other specified congenital malformations of skin: Secondary | ICD-10-CM

## 2022-08-29 DIAGNOSIS — C801 Malignant (primary) neoplasm, unspecified: Secondary | ICD-10-CM | POA: Insufficient documentation

## 2022-08-29 NOTE — Progress Notes (Unsigned)
  Subjective:  Patient ID: Mindy King, female    DOB: 1951-06-26,  MRN: [id number]  Mindy King presents to clinic today for {jgcomplaint:23593}  Chief Complaint  Patient presents with   Nail Problem    [facility] PCP-Penny Jones PCP VST-2 weeks ago   New problem(s): None. {jgcomplaint:23593}  PCP is Berkley Harvey, NP.  Allergies  Allergen Reactions   Ampicillin    Mometasone Furo-Formoterol Fum Diarrhea    Abdominal cramps    Review of Systems: Negative except as noted in the HPI.  Objective: No changes noted in today's physical examination. Vitals:   08/29/22 0956  BP: 131/73   Mindy King is a pleasant 72 y.o. female thin build in NAD. AAO x 3.  Neurovascular Examination: CFT <3 seconds b/l LE. Palpable DP/PT pulses b/l LE. Digital hair absent b/l. Skin temperature gradient WNL b/l. No pain with calf compression b/l. No edema noted b/l. No cyanosis or clubbing noted b/l LE.  Pt has subjective symptoms of neuropathy. Protective sensation intact 5/5 intact bilaterally with 10g monofilament b/l. Vibratory sensation intact b/l.  Dermatological:  Pedal integument with normal turgor, texture and tone BLE. No open wounds b/l LE. No interdigital macerations noted b/l LE.   Toenails left great toe, right 2nd toe are painful, elongated, discolored, dystrophic with subungual debris. Pain with dorsal palpation of nailplates. No erythema, no edema, no drainage noted.   Porokeratotic lesion(s) bilateral great toes, L 3rd toe, L 4th toe, L 5th toe, B 2nd toe, submet head 1 right foot, submet head 4 right foot, and submet head 5 left foot. No erythema, no edema, no drainage, no fluctuance.  Musculoskeletal:  HAV with bunion deformity noted b/l LE. Hammertoe deformity noted 2-5 b/l. Patient ambulates independent of any assistive aids.  Assessment/Plan: 1. Onychomycosis   2. Corns and callosities   3. 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   None {Jgplan:[id number]::"-Patient/POA to call should there be question/concern in the interim."}   Return in about 9 weeks (around 10/31/2022).  Marzetta Board, DPM

## 2022-11-04 ENCOUNTER — Ambulatory Visit (INDEPENDENT_AMBULATORY_CARE_PROVIDER_SITE_OTHER): Payer: 59 | Admitting: Podiatry

## 2022-11-04 VITALS — BP 127/79

## 2022-11-04 DIAGNOSIS — M79671 Pain in right foot: Secondary | ICD-10-CM | POA: Diagnosis not present

## 2022-11-04 DIAGNOSIS — G620 Drug-induced polyneuropathy: Secondary | ICD-10-CM

## 2022-11-04 DIAGNOSIS — Q828 Other specified congenital malformations of skin: Secondary | ICD-10-CM

## 2022-11-04 DIAGNOSIS — T451X5A Adverse effect of antineoplastic and immunosuppressive drugs, initial encounter: Secondary | ICD-10-CM

## 2022-11-04 DIAGNOSIS — M79672 Pain in left foot: Secondary | ICD-10-CM

## 2022-11-06 ENCOUNTER — Encounter: Payer: Self-pay | Admitting: Podiatry

## 2022-11-06 NOTE — Progress Notes (Signed)
  Subjective:  Patient ID: Mindy King, female    DOB: 13-Dec-1950,  MRN: [id number]  [name] presents to clinic today for at risk foot care with h/o neuropathy secondary to chemotherapy and corn(s) both feet, porokeratotic lesion(s) both feet,and painful mycotic nails. Painful toenails interfere with ambulation. Aggravating factors include wearing enclosed shoe gear. Pain is relieved with periodic professional debridement. Painful corns, callus(es) and porokeratotic lesion(s) are aggravated when weightbearing with and without shoegear. Pain is relieved with periodic professional debridement.  Chief Complaint  Patient presents with   Nail Problem    RFC PCP-Penny Jones PCP VST-"Couple months ago"   New problem(s): None.   Patient states she has moved into her own apartment in a senior community [place].  PCP is Iona Hansen, NP.  Allergies  Allergen Reactions   Ampicillin    Mometasone Furo-Formoterol Fum Diarrhea    Abdominal cramps    Review of Systems: Negative except as noted in the HPI.  Objective: No changes noted in today's physical examination. Vitals:   11/04/22 0831  BP: 127/79   Mindy King is a pleasant 72 y.o. female thin build in NAD. AAO x 3.  Neurovascular Examination: CFT <3 seconds b/l LE. Palpable DP/PT pulses b/l LE. Digital hair absent b/l. Skin temperature gradient WNL b/l. No pain with calf compression b/l. No edema noted b/l. No cyanosis or clubbing noted b/l LE.  Pt has subjective symptoms of neuropathy. Protective sensation intact 5/5 intact bilaterally with 10g monofilament b/l. Vibratory sensation intact b/l.  Dermatological:  Pedal integument with normal turgor, texture and tone BLE. No open wounds b/l LE. No interdigital macerations noted b/l LE.   Toenails adequate length on today's visit.  Porokeratotic lesion(s) bilateral great toes, L 3rd toe, L 4th toe, L 5th toe, B 2nd toe, submet head 1 right foot, submet head 4 right foot, and  submet head 5 left foot. No erythema, no edema, no drainage, no fluctuance.  Musculoskeletal:  HAV with bunion deformity noted b/l LE. Hammertoe deformity noted 2-5 b/l. Patient ambulates independent of any assistive aids.  Assessment/Plan: 1. 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 Pain in both feet   -Patient was evaluated and treated. All patient's and/or POA's questions/concerns answered on today's visit. -Porokeratotic lesion(s) bilateral 2nd toes, medial IPJ of left great toe, medial IPJ of right great toe, 3-5 left foot, submet head 1 right foot, submet head 4 right foot, and submet head 5 left foot pared and enucleated with sterile currette without incident. Total number of lesions debrided=10. -Patient/POA to call should there be question/concern in the interim.   Return in about 6 weeks (around 12/16/2022).  Freddie Breech, DPM

## 2022-11-17 ENCOUNTER — Encounter: Payer: Self-pay | Admitting: Ophthalmology

## 2022-12-08 ENCOUNTER — Ambulatory Visit: Payer: Medicare (Managed Care) | Admitting: Ophthalmology

## 2022-12-08 ENCOUNTER — Encounter: Payer: Self-pay | Admitting: Ophthalmology

## 2022-12-08 ENCOUNTER — Other Ambulatory Visit: Payer: Self-pay

## 2022-12-08 DIAGNOSIS — H259 Unspecified age-related cataract: Secondary | ICD-10-CM

## 2022-12-08 DIAGNOSIS — H539 Unspecified visual disturbance: Secondary | ICD-10-CM

## 2022-12-08 NOTE — Progress Notes (Signed)
#   Cataract OU  Potentially VS   Told by PA that she cas cataract on home visit  BCVA remains 20/20 20/25   Glare Testing     High   Right 20/25   Left 20/30     Will observe for now       # Diabetes   HBA1C 8.5  DFE no signs of DR   OCT Mac no DME   Advise strict BS control     #Glaucoma suspect   Based on OD appearance and +Fhx  Will do Glaucoma evaluation next visit     Assessment/Plan:    12/08/2022    Ocular Medications:  None    RTC: 2 months Glaucoma eval and Cat FU

## 2023-01-24 ENCOUNTER — Encounter: Payer: Self-pay | Admitting: Podiatry

## 2023-01-24 ENCOUNTER — Ambulatory Visit (INDEPENDENT_AMBULATORY_CARE_PROVIDER_SITE_OTHER): Payer: 59 | Admitting: Podiatry

## 2023-01-24 ENCOUNTER — Ambulatory Visit: Payer: Medicare (Managed Care)

## 2023-01-24 ENCOUNTER — Encounter: Payer: Self-pay | Admitting: Ophthalmology

## 2023-01-24 ENCOUNTER — Other Ambulatory Visit: Payer: Self-pay

## 2023-01-24 ENCOUNTER — Ambulatory Visit: Payer: Medicare (Managed Care) | Admitting: Ophthalmology

## 2023-01-24 VITALS — BP 119/72 | HR 78

## 2023-01-24 DIAGNOSIS — T451X5A Adverse effect of antineoplastic and immunosuppressive drugs, initial encounter: Secondary | ICD-10-CM

## 2023-01-24 DIAGNOSIS — M79674 Pain in right toe(s): Secondary | ICD-10-CM

## 2023-01-24 DIAGNOSIS — G620 Drug-induced polyneuropathy: Secondary | ICD-10-CM

## 2023-01-24 DIAGNOSIS — Q828 Other specified congenital malformations of skin: Secondary | ICD-10-CM

## 2023-01-24 DIAGNOSIS — M79675 Pain in left toe(s): Secondary | ICD-10-CM

## 2023-01-24 DIAGNOSIS — M79671 Pain in right foot: Secondary | ICD-10-CM | POA: Diagnosis not present

## 2023-01-24 DIAGNOSIS — B351 Tinea unguium: Secondary | ICD-10-CM

## 2023-01-24 DIAGNOSIS — M79672 Pain in left foot: Secondary | ICD-10-CM

## 2023-01-24 DIAGNOSIS — H40003 Preglaucoma, unspecified, bilateral: Secondary | ICD-10-CM

## 2023-01-24 DIAGNOSIS — H259 Unspecified age-related cataract: Secondary | ICD-10-CM
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
## 2023-01-24 NOTE — Progress Notes (Signed)
  Subjective:  Patient ID: Mindy King, female    DOB: 1951/06/16,  MRN: [id number]  Mindy King presents to clinic today for at risk foot care with history of chemotherapy induced neuropathy and painful porokeratotic lesion(s) both feet and painful mycotic toenails that limit ambulation. Painful toenails interfere with ambulation. Aggravating factors include wearing enclosed shoe gear. Pain is relieved with periodic professional debridement. Painful porokeratotic lesions are aggravated when weightbearing with and without shoegear. Pain is relieved with periodic professional debridement.  Chief Complaint  Patient presents with   Diabetes    "Do the norm." Dr. Zoe Lan - 12/01/2022, Doesn't check Glucose - Stated she was Diagnosed with Neuropathy   New problem(s): None.   PCP is Iona Hansen, NP.  Allergies  Allergen Reactions   Ampicillin    Mometasone Furo-Formoterol Fum Diarrhea    Abdominal cramps    Review of Systems: Negative except as noted in the HPI.  Objective: No changes noted in today's physical examination. Vitals:   01/24/23 0839  BP: 119/72  Pulse: 78   Mindy King is a pleasant 72 y.o. female thin build in NAD. AAO x 3.  Neurovascular Examination: CFT <3 seconds b/l LE. Palpable DP/PT pulses b/l LE. Digital hair absent b/l. Skin temperature gradient WNL b/l. No pain with calf compression b/l. No edema noted b/l. No cyanosis or clubbing noted b/l LE.  Pt has subjective symptoms of neuropathy. Protective sensation intact 5/5 intact bilaterally with 10g monofilament b/l. Vibratory sensation intact b/l.  Dermatological:  Pedal integument with normal turgor, texture and tone BLE. No open wounds b/l LE. No interdigital macerations noted b/l LE.   Toenails bilateral 2nd digits thick, elongated, dystrophic with subungual debris. Remaining nails adequate length.  Porokeratotic lesion(s) bilateral great toes, L 3rd toe, L 4th toe, L 5th toe, B 2nd toe, submet head  1 right foot, submet head 4 right foot, and submet head 5 left foot. No erythema, no edema, no drainage, no fluctuance.  Musculoskeletal:  HAV with bunion deformity noted b/l LE. Hammertoe deformity noted 2-5 b/l. Patient ambulates independent of any assistive aids.  Assessment/Plan: 1. Pain due to onychomycosis of toenails of both feet   2. Porokeratosis   3. Peripheral neuropathy due to chemotherapy (HCC)   4. Pain in both feet     -Consent given for treatment as described below: -Examined patient. -Toenails were debrided in length and girth bilateral 2nd toes with sterile nail nippers and dremel without iatrogenic bleeding.  -Porokeratotic lesion(s) bilateral great toes, L 2nd toe, L 3rd toe, L 4th toe, L 5th toe, R 2nd toe, submet head 1 right foot, submet head 4 right foot, and submet head 5 left foot pared and enucleated with sterile currette without incident. Total number of lesions debrided=10. -Patient/POA to call should there be question/concern in the interim.   Return in about 14 weeks (around 05/02/2023).  Mindy King, DPM

## 2023-03-13 IMAGING — US US THYROID
1 series · 14 of 25 positions shown · non-contrast
Comparison: None.

CLINICAL DATA: Nodule on physical exam

EXAM:
THYROID ULTRASOUND
TECHNIQUE: Ultrasound examination of the thyroid gland and adjacent soft
tissues was performed.

[Series 1: us thyroid · 0.05mm/px · 14 of 38 slices shown]
[im 1/38]
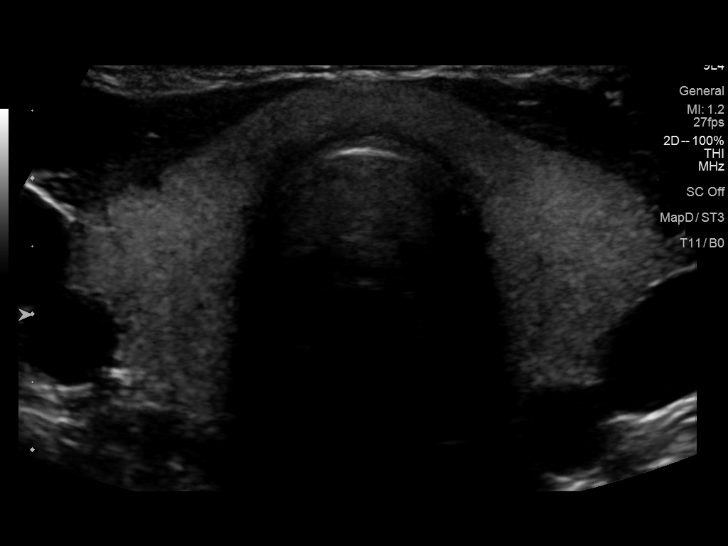
[im 4/38]
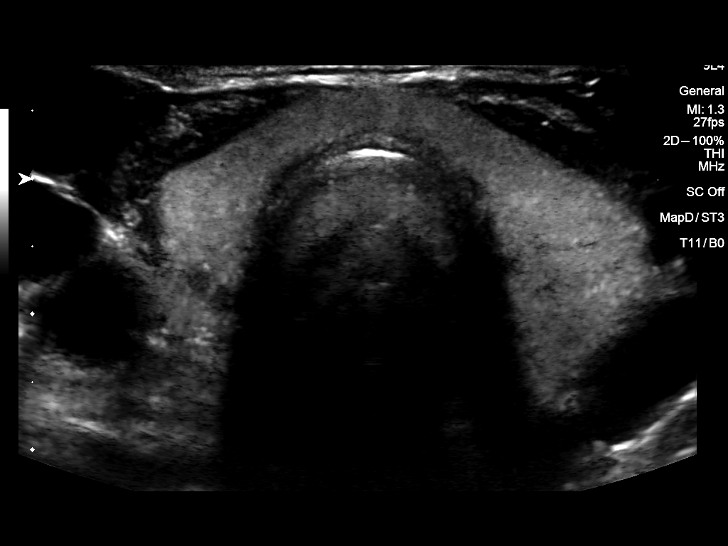
[im 7/38]
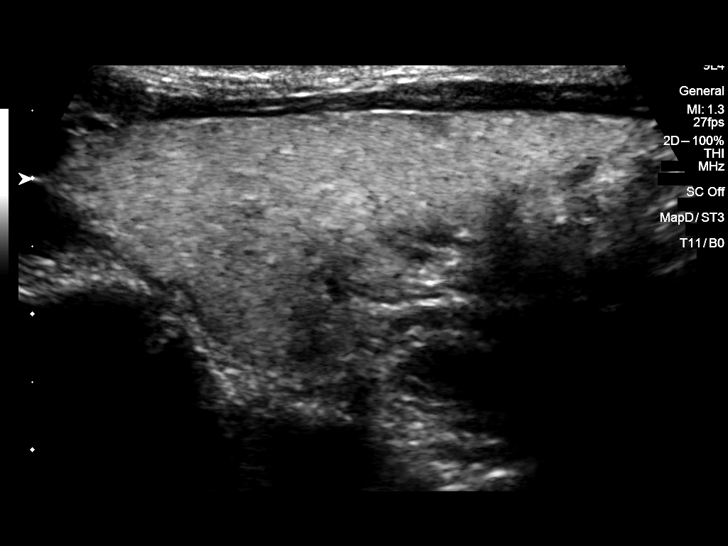
[im 10/38]
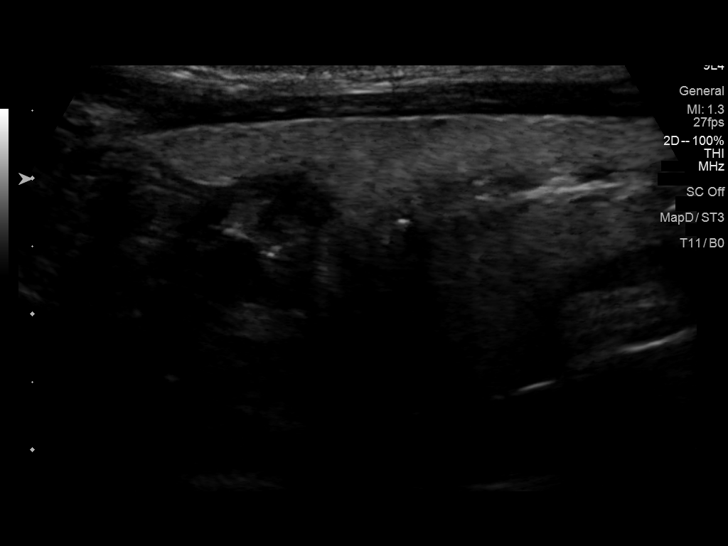
[im 13/38]
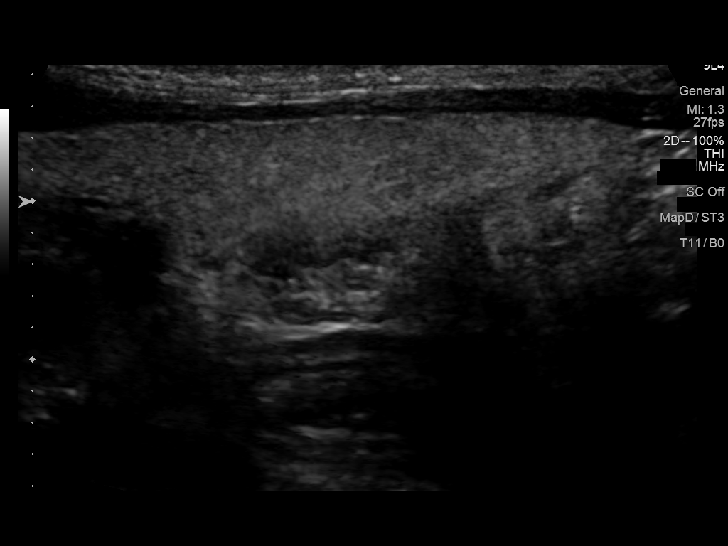
[im 14/38]
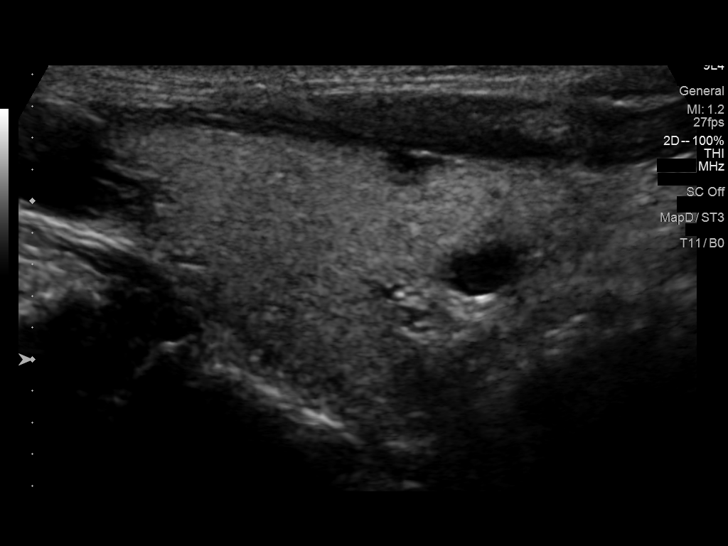
[im 17/38]
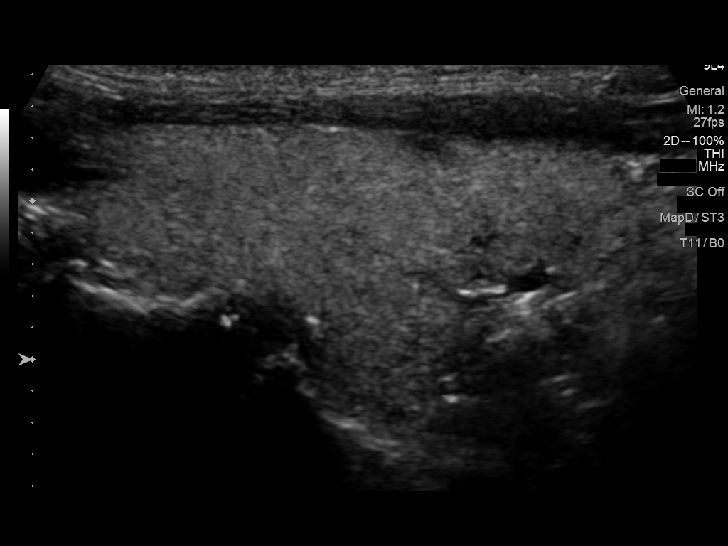
[im 21/38]
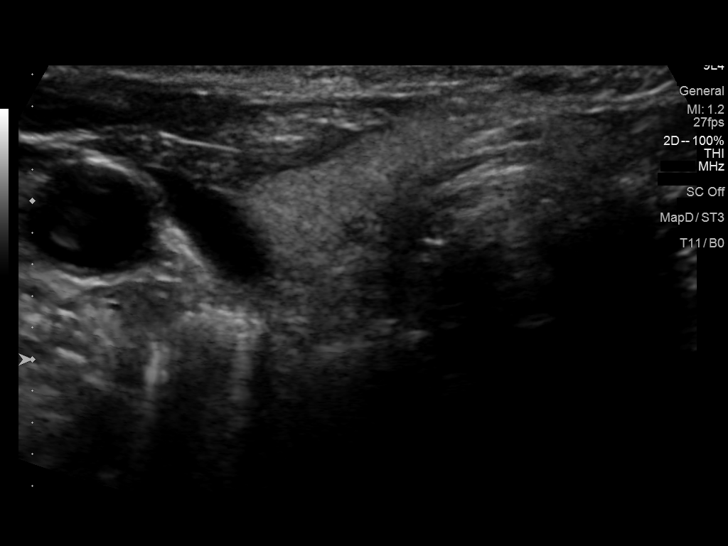
[im 24/38]
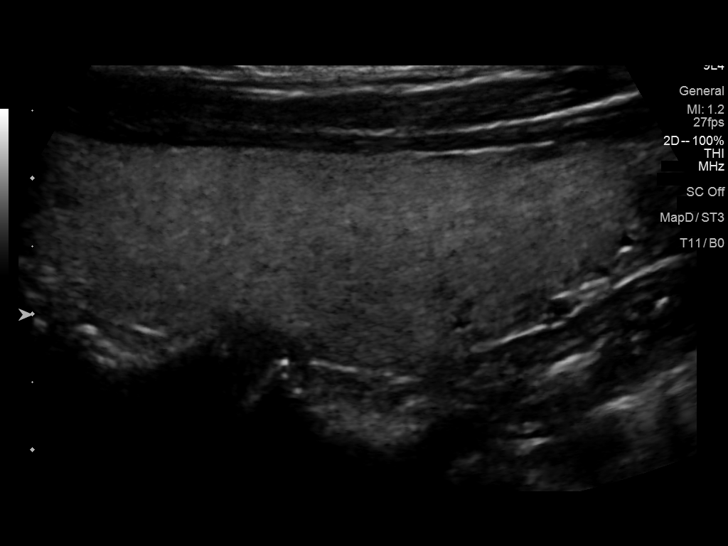
[im 25/38]
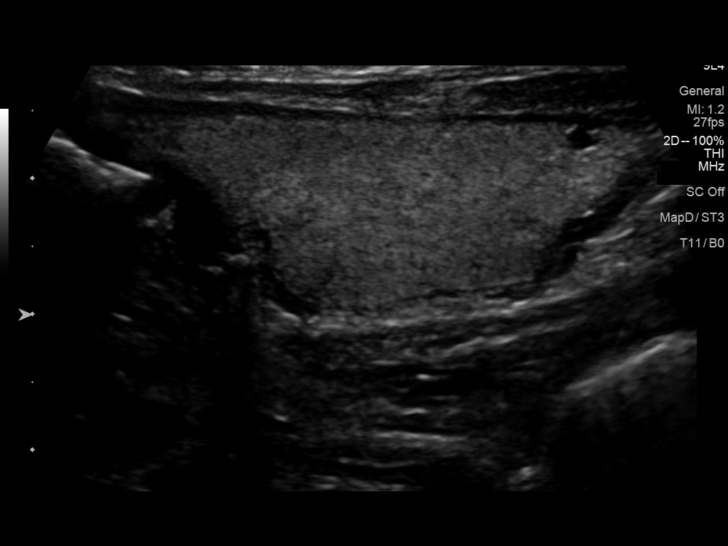
[im 28/38]
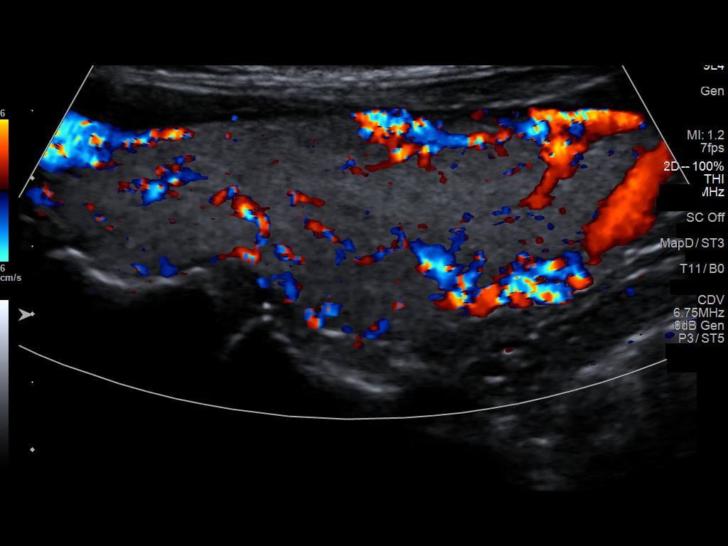
[im 31/38]
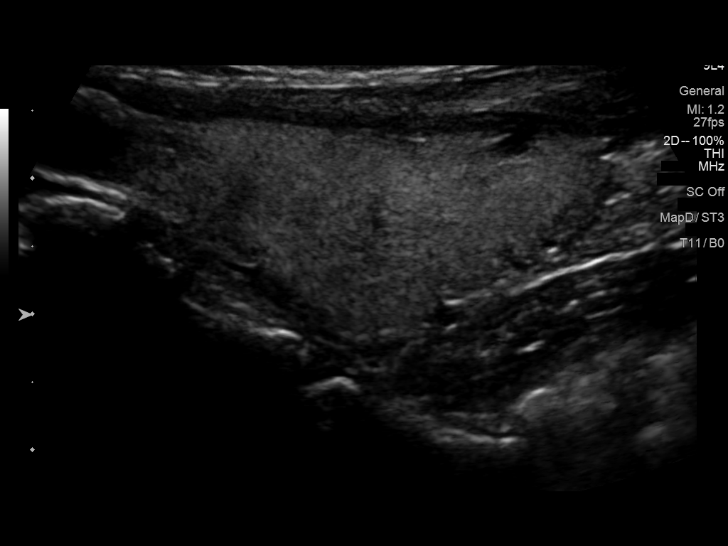
[im 34/38]
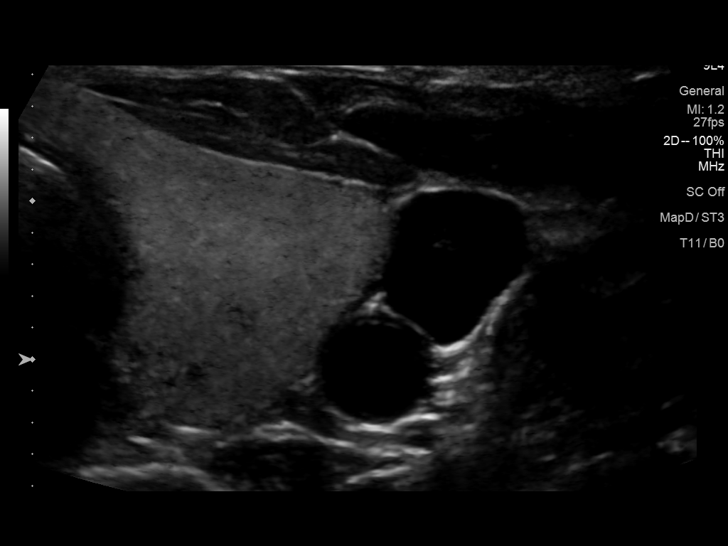
[im 38/38]
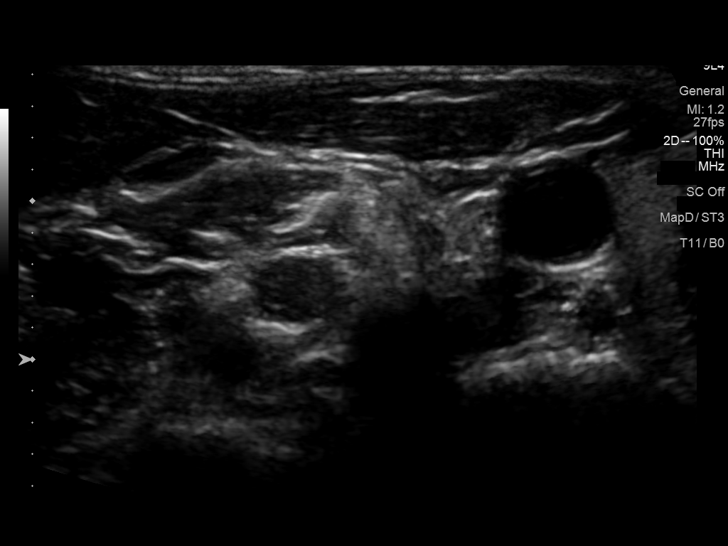

[14 of 25 positions shown; findings below may reference images not displayed]

FINDINGS: Parenchymal Echotexture: Normal

Isthmus: 0.4 cm

Right lobe: 4.5 x 1.9 x 1.6 cm

Left lobe: 4.4 x 1.6 x 1.6 cm

_________________________________________________________

Estimated total number of nodules >/= 1 cm: 0

Number of spongiform nodules >/=  2 cm not described below (TR1): 0

Number of mixed cystic and solid nodules >/= 1.5 cm not described
below (TR2): 0

_________________________________________________________

No discrete nodules are seen within the thyroid gland.
IMPRESSION: Normal thyroid ultrasound

The above is in keeping with the ACR TI-RADS recommendations - [HOSPITAL] 4672;[DATE].

## 2023-05-02 ENCOUNTER — Ambulatory Visit (INDEPENDENT_AMBULATORY_CARE_PROVIDER_SITE_OTHER): Payer: 59 | Admitting: Podiatry

## 2023-05-02 ENCOUNTER — Encounter: Payer: Self-pay | Admitting: Podiatry

## 2023-05-02 DIAGNOSIS — M79674 Pain in right toe(s): Secondary | ICD-10-CM

## 2023-05-02 DIAGNOSIS — B351 Tinea unguium: Secondary | ICD-10-CM | POA: Diagnosis not present

## 2023-05-02 DIAGNOSIS — M79675 Pain in left toe(s): Secondary | ICD-10-CM

## 2023-05-02 DIAGNOSIS — M79672 Pain in left foot: Secondary | ICD-10-CM

## 2023-05-02 DIAGNOSIS — T451X5A Adverse effect of antineoplastic and immunosuppressive drugs, initial encounter: Secondary | ICD-10-CM | POA: Diagnosis not present

## 2023-05-02 DIAGNOSIS — M79671 Pain in right foot: Secondary | ICD-10-CM | POA: Diagnosis not present

## 2023-05-02 DIAGNOSIS — Q828 Other specified congenital malformations of skin: Secondary | ICD-10-CM | POA: Diagnosis not present

## 2023-05-02 DIAGNOSIS — G620 Drug-induced polyneuropathy: Secondary | ICD-10-CM | POA: Diagnosis not present

## 2023-05-02 NOTE — Progress Notes (Signed)
Subjective:  Patient ID: Mindy King, female    DOB: [birth year],  MRN: [id number]  Parish Durie presents to clinic today for at risk foot care with history of peripheral neuropathy and painful porokeratotic lesion(s) of both feet and painful mycotic toenails that limit ambulation. Painful toenails interfere with ambulation. Aggravating factors include wearing enclosed shoe gear. Pain is relieved with periodic professional debridement. Painful porokeratotic lesions are aggravated when weightbearing with and without shoegear. Pain is relieved with periodic professional debridement. Patient is requesting xrays due to painful feet and multiple lesions. States her lung nodules are getting larger. Chief Complaint  Patient presents with   RFC    RFC Requesting Xray    Callouses   New problem(s): None.   PCP is Iona Hansen, NP.  Allergies  Allergen Reactions   Ampicillin    Mometasone Furo-Formoterol Fum Diarrhea    Abdominal cramps    Review of Systems: Negative except as noted in the HPI.  Objective:  There were no vitals filed for this visit. Mindy King is a pleasant 72 y.o. female thin build in NAD. AAO x 3.  Neurovascular Examination: CFT <3 seconds b/l LE. Palpable DP/PT pulses b/l LE. Digital hair absent b/l. Skin temperature gradient WNL b/l. No pain with calf compression b/l. No edema noted b/l. No cyanosis or clubbing noted b/l LE.  Pt has subjective symptoms of neuropathy. Protective sensation intact 5/5 intact bilaterally with 10g monofilament b/l. Vibratory sensation intact b/l.  Dermatological:  Pedal integument with normal turgor, texture and tone BLE. No open wounds b/l LE. No interdigital macerations noted b/l LE.   Toenails bilateral 2nd digits thick, elongated, dystrophic with subungual debris. Remaining nails adequate length.  Porokeratotic lesion(s) bilateral great toes, L 3rd toe, L 4th toe, L 5th toe, B 2nd toe, submet head 1 right foot, submet head 4 right  foot, and submet head 5 left foot. No erythema, no edema, no drainage, no fluctuance.  Musculoskeletal:  HAV with bunion deformity noted b/l LE. Hammertoe deformity noted 2-5 b/l. Patient ambulates independent of any assistive aids.  Assessment/Plan: 1. Pain due to onychomycosis of toenails of both feet   2. Porokeratosis   3. Peripheral neuropathy due to chemotherapy (HCC)   4. Pain in both feet    -Patient was evaluated and treated. All patient's and/or POA's questions/concerns answered on today's visit. -Discussed hammertoe deformity with patient. During visit, she declined xrays at this visit and I will refer her to Dr. Lilian Kapur for her concerns of arthritis. -Patient to continue soft, supportive shoe gear daily. -Toenails were debrided in length and girth bilateral great toes and bilateral 2nd toes with sterile nail nippers and dremel without iatrogenic bleeding.  -Porokeratotic lesion(s) bilateral 2nd toes, L hallux, left third digit, left fourth digit, left fifth digit, R hallux, submet head 1 right foot, submet head 4 right foot, and submet head 5 left foot pared and enucleated with sterile currette without incident. Total number of lesions debrided=10. -Patient/POA to call should there be question/concern in the interim.   Return in about 3 months (around 08/02/2023).  Freddie Breech, DPM

## 2023-06-15 ENCOUNTER — Ambulatory Visit: Payer: 59 | Admitting: Podiatry

## 2023-06-15 ENCOUNTER — Ambulatory Visit (INDEPENDENT_AMBULATORY_CARE_PROVIDER_SITE_OTHER): Payer: 59

## 2023-06-15 ENCOUNTER — Encounter: Payer: Self-pay | Admitting: Podiatry

## 2023-06-15 DIAGNOSIS — G609 Hereditary and idiopathic neuropathy, unspecified: Secondary | ICD-10-CM

## 2023-06-15 DIAGNOSIS — M778 Other enthesopathies, not elsewhere classified: Secondary | ICD-10-CM

## 2023-06-15 MED ORDER — GABAPENTIN 100 MG PO CAPS: 100.0000 mg | ORAL_CAPSULE | Freq: Every day | ORAL | 1 refills | Status: DC

## 2023-06-15 NOTE — Patient Instructions (Signed)
VISIT SUMMARY:  You were seen today for severe foot pain, described as a burning, tingling, and aching sensation, particularly in your toes. This pain has significantly impacted your quality of life, making it difficult to walk. You also reported numbness in your toes when walking. We discussed your history of lung cancer, which was surgically removed in 2011 but has since recurred. You have chosen not to pursue chemotherapy or radiation treatment for the recurrence. Additionally, we reviewed your current medications and smoking status.  YOUR PLAN:  -IDIOPATHIC PERIPHERAL NEUROPATHY: This condition involves nerve damage that causes pain, numbness, and tingling in your feet. We will start you on Gabapentin 100mg  at bedtime and adjust the dose as needed to help control your symptoms. Please contact the office in 1 month to assess how well the medication is working.  -HALLUX VALGUS AND HAMMERTOE DEFORMITIES: These are structural deformities of the toes. Although they are present, they are not the primary cause of your foot pain. Since you are not a good candidate for surgery, no intervention is planned at this time.  -LUNG CANCER: You have a history of lung cancer that was surgically removed in 2011 but has recurred. You have chosen not to undergo chemotherapy or radiation, so no further treatment is planned at this time.  INSTRUCTIONS:  Please follow up in 6 months to assess the response to Gabapentin and the progression of your foot pain. Additionally, contact the office in 1 month to discuss how well the medication is working.

## 2023-06-15 NOTE — Progress Notes (Signed)
  Subjective:  Patient ID: Mindy King, female    DOB: 1951-01-07,  MRN: [id number]  Chief Complaint  Patient presents with   Foot Pain    Bilateral foot pain,numbness,cramping.    Discussed the use of AI scribe software for clinical note transcription with the patient, who gave verbal consent to proceed.  History of Present Illness   The patient presents with severe, diffuse foot pain, described as a burning, tingling, aching sensation, particularly in the toes. The pain is so severe that it significantly impacts her quality of life, making it difficult to walk. She reports that her toes become numb when walking. She has been seen by Dr. Donzetta Matters for callus management. She denies any history of diabetes, but reports being told she is prediabetic. She has a history of lung cancer, which was surgically removed in 2011, but has since recurred. She has chosen not to pursue chemotherapy or radiation treatment for the recurrence. She also reports taking atorvastatin, montelukast, Symbicort, Spiriva, and various vitamins. She denies any history of alcohol or drug use, but does smoke cigarettes. She denies any low back issues.          Objective:    Physical Exam   MUSCULOSKELETAL: Feet warm, well-pulsed, presence of varicose veins, hallux valgus deformity, and hammer toe deformities, described as semi-rigid. NEUROLOGICAL: Diffuse tenderness across the tips of the toes, subjective numbness and paresthesias, monofilament test intact for protective sensation.       No images are attached to the encounter.    Results   RADIOLOGY Foot X-ray: midfoot arthritis, hammertoe deformities, and bunion deformities      Assessment:   1. Capsulitis of foot   2. Idiopathic peripheral neuropathy      Plan:  Patient was evaluated and treated and all questions answered.  Assessment and Plan    Idiopathic Peripheral Neuropathy   She experiences diffuse foot pain, numbness, and paresthesias without  a history of diabetes or chemotherapy exposure. Her protective sensation remains intact, suggesting the pain is likely secondary to neuropathy rather than structural foot deformities. We will start Gabapentin 100mg  at bedtime and titrate as needed for symptom control. I recommended 300mg  TID but she was concerned about AEs. She should contact the office in 1 month to assess the response to medication.  Hallux Valgus and Hammertoe Deformities   Exam findings include hallux valgus and hammertoe deformities, which are not the primary cause of her foot pain. She is not considered a good candidate for surgery, so no intervention is planned at this time.  Lung Cancer   She has a history of lung cancer, which was resected in 2011 and recurred 13 years later. She declines chemotherapy or radiation, so no intervention is planned at this time.  Follow-up in 6 months is scheduled to assess the response to Gabapentin and the progression of foot pain.          Return in about 6 months (around 12/13/2023) for follow up on neuropathy.

## 2023-07-01 ENCOUNTER — Encounter (HOSPITAL_COMMUNITY): Payer: Self-pay | Admitting: Emergency Medicine

## 2023-07-01 ENCOUNTER — Emergency Department (HOSPITAL_COMMUNITY): Payer: 59

## 2023-07-01 ENCOUNTER — Emergency Department (HOSPITAL_COMMUNITY)
Admission: EM | Admit: 2023-07-01 | Discharge: 2023-07-01 | Disposition: A | Discharge status: Home / Self Care | Payer: 59 | Attending: Emergency Medicine | Admitting: Emergency Medicine

## 2023-07-01 ENCOUNTER — Other Ambulatory Visit: Payer: Self-pay

## 2023-07-01 DIAGNOSIS — Z85118 Personal history of other malignant neoplasm of bronchus and lung: Secondary | ICD-10-CM | POA: Insufficient documentation

## 2023-07-01 DIAGNOSIS — I6782 Cerebral ischemia: Secondary | ICD-10-CM | POA: Diagnosis not present

## 2023-07-01 DIAGNOSIS — R1084 Generalized abdominal pain: Secondary | ICD-10-CM | POA: Diagnosis not present

## 2023-07-01 DIAGNOSIS — K1379 Other lesions of oral mucosa: Secondary | ICD-10-CM | POA: Diagnosis not present

## 2023-07-01 DIAGNOSIS — R062 Wheezing: Secondary | ICD-10-CM | POA: Diagnosis not present

## 2023-07-01 DIAGNOSIS — Z1152 Encounter for screening for COVID-19: Secondary | ICD-10-CM | POA: Insufficient documentation

## 2023-07-01 DIAGNOSIS — R519 Headache, unspecified: Secondary | ICD-10-CM | POA: Diagnosis present

## 2023-07-01 DIAGNOSIS — R6889 Other general symptoms and signs: Secondary | ICD-10-CM

## 2023-07-01 LAB — CBC WITH DIFFERENTIAL/PLATELET
Abs Immature Granulocytes: 0.01 10*3/uL (ref 0.00–0.07)
Basophils Absolute: 0 10*3/uL (ref 0.0–0.1)
Basophils Relative: 1 %
Eosinophils Absolute: 0 10*3/uL (ref 0.0–0.5)
Eosinophils Relative: 1 %
HCT: 44.1 % (ref 36.0–46.0)
Hemoglobin: 14.5 g/dL (ref 12.0–15.0)
Immature Granulocytes: 0 %
Lymphocytes Relative: 34 %
Lymphs Abs: 2.2 10*3/uL (ref 0.7–4.0)
MCH: 29.5 pg (ref 26.0–34.0)
MCHC: 32.9 g/dL (ref 30.0–36.0)
MCV: 89.8 fL (ref 80.0–100.0)
Monocytes Absolute: 0.6 10*3/uL (ref 0.1–1.0)
Monocytes Relative: 9 %
Neutro Abs: 3.5 10*3/uL (ref 1.7–7.7)
Neutrophils Relative %: 55 %
Platelets: 199 10*3/uL (ref 150–400)
RBC: 4.91 MIL/uL (ref 3.87–5.11)
RDW: 14.8 % (ref 11.5–15.5)
WBC: 6.3 10*3/uL (ref 4.0–10.5)
nRBC: 0 % (ref 0.0–0.2)

## 2023-07-01 LAB — URINALYSIS, ROUTINE W REFLEX MICROSCOPIC
Bilirubin Urine: NEGATIVE
Glucose, UA: NEGATIVE mg/dL
Hgb urine dipstick: NEGATIVE
Ketones, ur: NEGATIVE mg/dL
Leukocytes,Ua: NEGATIVE
Nitrite: NEGATIVE
Protein, ur: NEGATIVE mg/dL
Specific Gravity, Urine: 1.01 (ref 1.005–1.030)
pH: 6 (ref 5.0–8.0)

## 2023-07-01 LAB — COMPREHENSIVE METABOLIC PANEL
ALT: 54 U/L — ABNORMAL HIGH (ref 0–44)
AST: 42 U/L — ABNORMAL HIGH (ref 15–41)
Albumin: 3.9 g/dL (ref 3.5–5.0)
Alkaline Phosphatase: 61 U/L (ref 38–126)
Anion gap: 11 (ref 5–15)
BUN: 12 mg/dL (ref 8–23)
CO2: 29 mmol/L (ref 22–32)
Calcium: 10 mg/dL (ref 8.9–10.3)
Chloride: 103 mmol/L (ref 98–111)
Creatinine, Ser: 0.75 mg/dL (ref 0.44–1.00)
GFR, Estimated: >60 mL/min (ref >60)
Glucose, Bld: 94 mg/dL (ref 70–99)
Potassium: 4.3 mmol/L (ref 3.5–5.1)
Sodium: 143 mmol/L (ref 135–145)
Total Bilirubin: 0.9 mg/dL (ref <1.2)
Total Protein: 6.9 g/dL (ref 6.5–8.1)

## 2023-07-01 LAB — RESP PANEL BY RT-PCR (RSV, FLU A&B, COVID)  RVPGX2
Influenza A by PCR: NEGATIVE
Influenza B by PCR: NEGATIVE
Resp Syncytial Virus by PCR: NEGATIVE
SARS Coronavirus 2 by RT PCR: NEGATIVE

## 2023-07-01 LAB — GROUP A STREP BY PCR: Group A Strep by PCR: NOT DETECTED

## 2023-07-01 MED ORDER — SODIUM CHLORIDE 0.9 % IV BOLUS
1000.0000 mL | Freq: Once | INTRAVENOUS | Status: AC
  Administered 2023-07-01: 1000 mL via INTRAVENOUS

## 2023-07-01 MED ORDER — DIPHENHYDRAMINE HCL 50 MG/ML IJ SOLN
25.0000 mg | Freq: Once | INTRAMUSCULAR | Status: AC
  Administered 2023-07-01: 25 mg via INTRAVENOUS
  Filled 2023-07-01: qty 1

## 2023-07-01 MED ORDER — METOCLOPRAMIDE HCL 5 MG/ML IJ SOLN
10.0000 mg | Freq: Once | INTRAMUSCULAR | Status: AC
  Administered 2023-07-01: 10 mg via INTRAVENOUS
  Filled 2023-07-01: qty 2

## 2023-07-01 MED ORDER — IOHEXOL 350 MG/ML SOLN
75.0000 mL | Freq: Once | INTRAVENOUS | Status: AC | PRN
  Administered 2023-07-01: 75 mL via INTRAVENOUS

## 2023-07-01 MED ORDER — MAGNESIUM SULFATE 2 GM/50ML IV SOLN
2.0000 g | Freq: Once | INTRAVENOUS | Status: AC
  Administered 2023-07-01: 2 g via INTRAVENOUS
  Filled 2023-07-01: qty 50

## 2023-07-01 NOTE — Discharge Instructions (Addendum)
It is possible that your mouth hurt so much because you were dehydrated.  Of the good news is that you do not have strep or COVID.  And your CAT scans were reassuring.  This may be secondary to a viral illness, but if you feel like symptoms are worsening please return to the ER immediately.  I am glad you are feeling better.  You do have some findings of pulmonary nodules, please follow-up with your PCP in regards to this

## 2023-07-01 NOTE — ED Provider Notes (Signed)
[place] EMERGENCY DEPARTMENT AT [facility] Provider Note   CSN: [id number] Arrival date & time: 07/01/23  1007     History  Chief Complaint  Patient presents with   Headache    Mindy King is a 72 y.o. female, history of lung cancer, not on chemotherapy, who presents to the ED secondary to increased wheezing, sore throat, feeling like her tongue is on fire, and generalized abdominal pain and headache that is been going on for the last 3 to 4 days.  She states that her headache came on suddenly, about 4 days ago, and has gradually gotten worse.  Started in the front of her head, and has not moved all over.  States it feels very tight, and she has been taking Tylenol without relief.  She also states that she feels like she has had a fever, but has not had any documented fevers that she does not have a thermometer at home.  Reports increased wheezing, but has not been using her albuterol inhaler more frequently.  Also reports generalized bodyaches, and generalized abdominal pain, with no nausea, vomiting, or diarrhea for the last 3 days.  States she had diarrhea about 4 5 days ago, but has not had diarrhea since.  Just feels generally unwell.  Denies any neck stiffness.  No changes in vision, feels a little bit lightheaded, but no dizziness at this time.     Home Medications Prior to Admission medications   Medication Sig Start Date End Date Taking? Authorizing Provider  acetaminophen (TYLENOL) 500 MG tablet Take 1,000 mg by mouth every 6 (six) hours as needed for fever.    [provider]  albuterol (VENTOLIN HFA) 108 (90 Base) MCG/ACT inhaler Inhale into the lungs. 02/18/21   [provider]  Ascorbic Acid 500 MG CHEW Chew by mouth.    [provider]  atorvastatin (LIPITOR) 20 MG tablet Take 1 tablet by mouth daily. 08/25/20   [provider]  azelastine (ASTELIN) 0.1 % nasal spray Place 1 spray into both nostrils daily as needed for  rhinitis. 03/29/18   [provider]  B Complex-C (B-COMPLEX WITH VITAMIN C) tablet Take 1 tablet by mouth daily.    [provider]  Cholecalciferol 25 MCG (1000 UT) tablet Take 1,000 Units by mouth daily. 04/18/14   [provider]  ciclopirox (PENLAC) 8 % solution APPLY ONE COAT TO EACH TOENAIL DAILY FOR 48 WEEKS. REMOVE EVERY 7 DAYS WITH POLISH REMOVER 07/19/22   Freddie Breech, DPM  cyanocobalamin 100 MCG tablet Take 1 tablet by mouth daily. 08/25/20   [provider]  denosumab (PROLIA) 60 MG/ML SOSY injection Inject 60 mg into the skin every 6 (six) months. 03/28/18   [provider]  gabapentin (NEURONTIN) 100 MG capsule Take 1 capsule (100 mg total) by mouth at bedtime. 06/15/23   McDonald, Rachelle Hora, DPM  montelukast (SINGULAIR) 10 MG tablet Take 10 mg by mouth at bedtime.    [provider]  Multiple Vitamin (DAILY-VITE) TABS Take 1 tablet by mouth daily. 08/28/18   [provider]  omeprazole (PRILOSEC) 20 MG capsule Take 20 mg by mouth daily. 11/08/21   [provider]  ondansetron (ZOFRAN-ODT) 4 MG disintegrating tablet Take 1 tablet (4 mg total) by mouth every 8 (eight) hours as needed for nausea or vomiting. 05/12/22   Mardene Sayer, MD  sertraline (ZOLOFT) 50 MG tablet Take 50 mg by mouth every morning. 08/25/20   [provider]  SPIRIVA HANDIHALER 18 MCG inhalation capsule Place 1 capsule into inhaler and inhale daily. 05/22/18   [provider]  tetracycline (SUMYCIN) 500 MG capsule Take 500 mg by mouth 4 (four) times daily. 12/10/20   [provider]  valACYclovir (VALTREX) 1000 MG tablet Take one po daily x 30 days, then use one po x 5 days as needed for recurrent outbreaks. 02/18/21   [provider]  vitamin A 3 MG (10000 UNITS) capsule Take by mouth.    [provider]  vitamin E 45 MG (100 UNITS) capsule Take by mouth.    [provider]       Allergies    Ampicillin and Mometasone furo-formoterol fum    Review of Systems   Review of Systems  Gastrointestinal:  Positive for abdominal pain and diarrhea. Negative for nausea and vomiting.  Neurological:  Positive for headaches.    Physical Exam Updated Vital Signs BP 124/81   Pulse 90   Temp 98.6 F (37 C) (Oral)   Resp 17   Ht 5\' 4"  (1.626 m)   Wt 52.2 kg   SpO2 98%   BMI 19.74 kg/m  Physical Exam Vitals and nursing note reviewed.  Constitutional:      General: She is not in acute distress.    Appearance: She is well-developed.  HENT:     Head: Normocephalic and atraumatic.     Right Ear: Tympanic membrane normal.     Left Ear: Tympanic membrane normal.     Mouth/Throat:     Mouth: Mucous membranes are dry. No injury.     Tongue: No lesions.     Pharynx: Oropharynx is clear. Posterior oropharyngeal erythema present. No pharyngeal swelling or oropharyngeal exudate.  Eyes:     Conjunctiva/sclera: Conjunctivae normal.  Cardiovascular:     Rate and Rhythm: Normal rate and regular rhythm.     Heart sounds: No murmur heard. Pulmonary:     Effort: Pulmonary effort is normal. No respiratory distress.     Breath sounds: Normal breath sounds.  Abdominal:     Palpations: Abdomen is soft.     Tenderness: There is generalized abdominal tenderness.  Musculoskeletal:        General: No swelling.     Cervical back: Neck supple. No rigidity.  Skin:    General: Skin is warm and dry.     Capillary Refill: Capillary refill takes less than 2 seconds.  Neurological:     Mental Status: She is alert.  Psychiatric:        Mood and Affect: Mood normal.     ED Results / Procedures / Treatments   Labs (all labs ordered are listed, but only abnormal results are displayed) Labs Reviewed  COMPREHENSIVE METABOLIC PANEL - Abnormal; Notable for the following components:      Result Value   AST 42 (*)    ALT 54 (*)    All other components within normal limits  RESP PANEL  BY RT-PCR (RSV, FLU A&B, COVID)  [id number]  GROUP A STREP BY PCR  CBC WITH DIFFERENTIAL/PLATELET  URINALYSIS, ROUTINE W REFLEX MICROSCOPIC    EKG None  Radiology CT ANGIO HEAD NECK W WO CM  Result Date: 07/01/2023 CLINICAL DATA:  Provided history: Severe left-sided headache. History of cancer. Additionally history obtained from electronic MEDICAL RECORD NUMBERHistory of lung cancer). EXAM: CT ANGIOGRAPHY HEAD AND NECK WITH AND WITHOUT CONTRAST TECHNIQUE: Multidetector CT imaging of the head and neck was performed using the standard protocol during  bolus administration of intravenous contrast. Multiplanar CT image reconstructions and MIPs were obtained to evaluate the vascular anatomy. Carotid stenosis measurements (when applicable) are obtained utilizing NASCET criteria, using the distal internal carotid diameter as the denominator. RADIATION DOSE REDUCTION: This exam was performed according to the departmental dose-optimization program which includes automated exposure control, adjustment of the mA and/or kV according to patient size and/or use of iterative reconstruction technique. CONTRAST:  75mL OMNIPAQUE IOHEXOL 350 MG/ML SOLN COMPARISON:  Brain MRI 10/26/2022. FINDINGS: CT HEAD FINDINGS Brain: Mild generalized cerebral volume loss. Moderate chronic Anwar Sakata vessel ischemic changes within the cerebral white matter and pons, better delineated on the prior brain MRI 10/26/2022. Frans Valente chronic infarcts within bilateral cerebellar hemispheres, some of which were better appreciated on the prior MRI. Known 4 mm pituitary gland lesion. There is no acute intracranial hemorrhage. No demarcated cortical infarct. No extra-axial fluid collection. No midline shift. Vascular: No hyperdense vessel.  Atherosclerotic calcifications. 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 No aggressive osseous lesion identified.  Sinuses/Orbits: No orbital mass or acute orbital finding. No significant paranasal sinus disease at the imaged levels. Review of the MIP images confirms the above findings CTA NECK FINDINGS Aortic arch: Common origin of the innominate left common carotid arteries. The visualized thoracic aorta is normal in caliber. Atherosclerotic plaque within the visualized aortic arch and proximal major branch vessels of the neck. Streak/beam hardening artifact arising from a dense right-sided contrast bolus partially obscures the right subclavian artery. Within this limitation, there is no appreciable hemodynamically significant innominate or proximal subclavian artery stenosis. Right carotid system: CCA and ICA patent within the neck without stenosis or significant atherosclerotic disease. Left carotid system: CCA and ICA patent within the neck without stenosis. Minimal atherosclerotic plaque within the proximal CCA and carotid bulb. Vertebral arteries: Patent within the neck without stenosis or significant atherosclerotic disease. The right vertebral artery is dominant. Skeleton: Spondylosis of the cervical and visualized upper thoracic levels. No acute fracture or aggressive osseous lesion identified. Other neck: No appreciable neck mass or cervical lymphadenopathy. Upper chest: Bilateral upper lobe pulmonary nodules at the imaged levels, grossly unchanged from the prior chest CT of 03/21/2023. Background centrilobular and paraseptal emphysema Review of the MIP images confirms the above findings CTA HEAD FINDINGS Anterior circulation: The intracranial internal carotid arteries are patent. Non-stenotic atherosclerotic plaque within both vessels. The M1 middle cerebral arteries are patent. No M2 proximal branch occlusion or high-grade proximal stenosis. The anterior cerebral arteries are patent. No intracranial aneurysm is identified. Posterior circulation: The intracranial vertebral arteries are patent. The basilar artery is  patent. The posterior cerebral arteries are patent. Posterior communicating arteries are present bilaterally. Venous sinuses: Within the limitations of contrast timing, no convincing thrombus. Anatomic variants: None significant. Review of the MIP images confirms the above findings IMPRESSION: CT head: 1. No evidence of an acute intracranial abnormality. 2. Parenchymal atrophy and chronic Hedaya Latendresse vessel ischemic disease with chronic infarcts, as described. 3. 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 4. 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 CTA neck: 1. The common carotid and internal carotid arteries are patent within the neck without stenosis. Minimal atherosclerotic plaque on the left, as described. 2. The vertebral arteries are patent within the neck without stenosis or  significant atherosclerotic disease. 3. Bilateral upper lobe pulmonary nodules at the imaged levels, grossly unchanged from the prior chest CT of 03/21/2023. 4. Aortic Atherosclerosis (ICD10-I70.0) and Emphysema (ICD10-J43.9). CTA head: 1. No proximal intracranial large vessel occlusion or proximal high-grade arterial stenosis identified. 2. Non-stenotic atherosclerotic plaque within the intracranial internal carotid arteries. Electronically Signed   By: Jackey Loge D.O.   On: 07/01/2023 13:57   CT ABDOMEN PELVIS W CONTRAST  Result Date: 07/01/2023 CLINICAL DATA:  Abdominal pain acute nonlocalized.  Lung cancer. EXAM: CT ABDOMEN AND PELVIS WITH CONTRAST TECHNIQUE: Multidetector CT imaging of the abdomen and pelvis was performed using the standard protocol following bolus administration of intravenous contrast. RADIATION DOSE REDUCTION: This exam was performed according to the departmental dose-optimization program which includes automated exposure control, adjustment of the  mA and/or kV according to patient size and/or use of iterative reconstruction technique. CONTRAST:  75mL OMNIPAQUE IOHEXOL 350 MG/ML SOLN COMPARISON:  PET scan 04/14/2023 FINDINGS: Lower chest: The lung bases are clear. Some pulmonary nodules are above the level of imaging. The heart size is normal. Hepatobiliary: No focal liver abnormality is seen. No gallstones, gallbladder wall thickening, or biliary dilatation. Pancreas: Unremarkable. No pancreatic ductal dilatation or surrounding inflammatory changes. Spleen: Normal in size without focal abnormality. Adrenals/Urinary Tract: Adrenal glands are unremarkable. Kidneys are normal, without renal calculi, focal lesion, or hydronephrosis. Bladder is unremarkable. Stomach/Bowel: The stomach and duodenum are within normal limits. The Terrionna Bridwell bowel is unremarkable. The terminal ileum is normal. The appendix is visualized and normal. The ascending and transverse colon are within normal limits. The descending and sigmoid colon are normal. Vascular/Lymphatic: Atherosclerotic calcifications are present in the aorta and branch vessels. No aneurysm is present. No significant adenopathy is present. Reproductive: Status post hysterectomy. No adnexal masses. Other: No abdominal wall hernia or abnormality. No abdominopelvic ascites. Musculoskeletal: Degenerative changes are present L5-S1, right greater than left. Focal osseous lesions are present. The hips are located and within normal limits. IMPRESSION: 1. No acute or focal lesion to explain the patient's symptoms. 2. Some pulmonary nodules are above the level of imaging. 3. Degenerative changes at L5-S1, right greater than left. 4.  Aortic Atherosclerosis (ICD10-I70.0). Electronically Signed   By: Marin Roberts M.D.   On: 07/01/2023 13:45   DG Chest 2 View  Result Date: 07/01/2023 CLINICAL DATA:  Wheezing with perceived fever for 4 days EXAM: CHEST - 2 VIEW COMPARISON:  05/12/2022 FINDINGS: Chronic hyperinflation.  Spiculated densities in the lungs by recent PET-CT. There is no edema, consolidation, effusion, or pneumothorax. Normal heart size and mediastinal contours. Postoperative left hilum. IMPRESSION: 1. Negative for pneumonia. 2. Underestimated pulmonary metastatic disease compared to cross-sectional imaging. Electronically Signed   By: Tiburcio Pea M.D.   On: 07/01/2023 12:31    Procedures Procedures    Medications Ordered in ED Medications  metoCLOPramide (REGLAN) injection 10 mg (10 mg Intravenous Given 07/01/23 1107)  diphenhydrAMINE (BENADRYL) injection 25 mg (25 mg Intravenous Given 07/01/23 1108)  sodium chloride 0.9 % bolus 1,000 mL (0 mLs Intravenous Stopped 07/01/23 1243)  magnesium sulfate IVPB 2 g 50 mL (0 g Intravenous Stopped 07/01/23 1402)  iohexol (OMNIPAQUE) 350 MG/ML injection 75 mL (75 mLs Intravenous Contrast Given 07/01/23 1331)    ED Course/ Medical Decision Making/ A&P                                 Medical Decision Making Patient is  a 72 year old female, here with multiple complaints including abdominal pain that is generalized, with mild tenderness to palpation of the abdomen.  She has no nausea, vomiting.  She did have diarrhea a few days ago.  We will obtain a CT abdomen pelvis, for further evaluation given her systemic symptoms.  Additionally she is complaining of mouth pain, generalized weakness, fatigue, and subjective fevers, we obtained a strep, COVID/flu for further evaluation and a chest x-ray to rule out any kind of pneumonia.  She states she is wheezing, however she is not wheezing on exam, she is not tachypneic, and she is not hypoxic.  She is overall very well-appearing.  Will obtain chest x-ray to rule out a pneumonia.  Additionally complains of headache, she has no focal deficits, and this is all over her head, initially was given Reglan, Benadryl, without relief of symptoms, obtain CTA head/neck, for further evaluation, given age, and persistent  headache  Amount and/or Complexity of Data Reviewed Labs: ordered.    Details: Unremarkable, no leukocytosis, no transaminitis, negative for COVID/flu. Radiology: ordered.    Details: Chest x-ray clear, CTA head/neck shows no acute findings discussed Discussion of management or test interpretation with external provider(s): Discussed with patient, CT head/neck is negative, headache is resolved after the magnesium.  This may have been related to have a tension headache, she had no neurodeficits, is well-appearing now, is reassuring.  Additionally strep, COVID negative.  Just generalized fatigue, and unwellness.  No evidence of any kind of leukocytosis, evidence of pneumonia, and is overall very well-appearing on exam.  May just represent a viral illness, given her symptoms, and slight diarrhea prior a few days prior.  We discussed conservative care, and return precautions and she voiced understanding.  CT abdomen pelvis shows no acute findings, likely related to viral illness.  Risk Prescription drug management.    Final Clinical Impression(s) / ED Diagnoses Final diagnoses:  Multiple complaints  Acute nonintractable headache, unspecified headache type  Mouth pain  Generalized abdominal pain    Rx / DC Orders ED Discharge Orders     None         Karlyn Glasco, Harley [place], PA 07/01/23 1421    Rolan Bucco, MD 07/01/23 1536

## 2023-07-01 NOTE — ED Notes (Signed)
Patient transported to CT 

## 2023-07-01 NOTE — ED Notes (Signed)
Patient discharged by this RN. Patient verbalizes understanding of instructions with no additional questions. Patient to be picked up by friend at discharge.

## 2023-07-01 NOTE — ED Triage Notes (Signed)
Pt POV came in due to abdominal pain, headache and states "her tongue is on fire".  Pt states she does have cancer in bilateral lungs.

## 2023-07-11 ENCOUNTER — Encounter: Payer: Medicare (Managed Care) | Admitting: Ophthalmology

## 2023-07-11 NOTE — Progress Notes (Signed)
This encounter was created in error - please disregard.

## 2023-08-16 ENCOUNTER — Ambulatory Visit (INDEPENDENT_AMBULATORY_CARE_PROVIDER_SITE_OTHER): Payer: 59 | Admitting: Podiatry

## 2023-08-16 ENCOUNTER — Encounter: Payer: Self-pay | Admitting: Podiatry

## 2023-08-16 VITALS — Ht 64.0 in | Wt 115.0 lb

## 2023-08-16 DIAGNOSIS — B351 Tinea unguium: Secondary | ICD-10-CM

## 2023-08-16 DIAGNOSIS — Q828 Other specified congenital malformations of skin: Secondary | ICD-10-CM | POA: Diagnosis not present

## 2023-08-16 DIAGNOSIS — M79675 Pain in left toe(s): Secondary | ICD-10-CM

## 2023-08-16 DIAGNOSIS — G620 Drug-induced polyneuropathy: Secondary | ICD-10-CM

## 2023-08-16 DIAGNOSIS — M79674 Pain in right toe(s): Secondary | ICD-10-CM

## 2023-08-16 DIAGNOSIS — T451X5A Adverse effect of antineoplastic and immunosuppressive drugs, initial encounter: Secondary | ICD-10-CM | POA: Diagnosis not present

## 2023-08-22 NOTE — Progress Notes (Signed)
  Subjective:  Patient ID: Mindy King, female    DOB: 04/23/1951,  MRN: [id number]  73 y.o. female presents at risk foot care with h/o neuropathy secondary to chemotherapy and painful porokeratotic lesion(s) b/l feet and painful mycotic toenails that limit ambulation. Painful toenails interfere with ambulation. Aggravating factors include wearing enclosed shoe gear. Pain is relieved with periodic professional debridement. Painful porokeratotic lesions are aggravated when weightbearing with and without shoegear. Pain is relieved with periodic professional debridement.  She did see Dr. Lilian Kapur for evaluation of her foot pain. She was given Rx for gabapentin, but states she discontinued taking it due to how it making her feel drowsy. Chief Complaint  Patient presents with   RFC    She is here for routine foot care, PCP is penny jones, last OV was 06/2023.    PCP is Iona Hansen, NP.  Allergies  Allergen Reactions   Ampicillin    Mometasone Furo-Formoterol Fum Diarrhea    Abdominal cramps    Review of Systems: Negative except as noted in the HPI.   Objective:  Mindy King is a pleasant 73 y.o. female thin build in NAD. AAO x 3.  Vascular Examination: Vascular status intact b/l with palpable pedal pulses. CFT immediate b/l. Pedal hair present. No edema. No pain with calf compression b/l. Skin temperature gradient WNL b/l. No varicosities noted. No cyanosis or clubbing noted.  Neurological Examination: Sensation grossly intact b/l with 10 gram monofilament. Vibratory sensation intact b/l.  Dermatological Examination: Pedal skin with normal turgor, texture and tone b/l. No open wounds nor interdigital macerations noted. Toenails 1-5 b/l thick, discolored, elongated with subungual debris and pain on dorsal palpation.   Porokeratotic lesion(s) bilateral great toes, L 3rd toe, L 4th toe, L 5th toe, B 2nd toe, submet head 1 right foot, submet head 4 right foot, and submet head 5 left  foot. No erythema, no edema, no drainage, no fluctuance.  Musculoskeletal:  HAV with bunion deformity noted b/l LE. Hammertoe deformity noted 2-5 b/l. Patient ambulates independent of any assistive aids.  Radiographs: None  Assessment:   1. Pain due to onychomycosis of toenails of both feet   2. 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 -Mycotic toenails 1-5 bilaterally were debrided in length and girth with sterile nail nippers and dremel without incident. -Porokeratotic lesion(s) bilateral great toes, L hallux, L 3rd toe, L 4th toe, L 5th toe, right great toe, submet head 1 right foot, submet head 4 right foot, and submet head 5 left foot pared and enucleated with sterile currette without incident. Total number of lesions debrided=10. -Patient/POA to call should there be question/concern in the interim.  Return in about 9 weeks (around 10/18/2023).  Freddie Breech, DPM      [place] LOCATION: [address], [place] [address]                   Office [phone]   [facility] LOCATION: [address] [place], [place] [address] Office [phone]

## 2023-10-25 ENCOUNTER — Encounter: Payer: Self-pay | Admitting: Podiatry

## 2023-10-25 ENCOUNTER — Ambulatory Visit (INDEPENDENT_AMBULATORY_CARE_PROVIDER_SITE_OTHER): Payer: 59 | Admitting: Podiatry

## 2023-10-25 VITALS — Ht 64.0 in | Wt 115.0 lb

## 2023-10-25 DIAGNOSIS — M79674 Pain in right toe(s): Secondary | ICD-10-CM

## 2023-10-25 DIAGNOSIS — Q828 Other specified congenital malformations of skin: Secondary | ICD-10-CM

## 2023-10-25 DIAGNOSIS — T451X5A Adverse effect of antineoplastic and immunosuppressive drugs, initial encounter: Secondary | ICD-10-CM

## 2023-10-25 DIAGNOSIS — B351 Tinea unguium: Secondary | ICD-10-CM

## 2023-10-25 DIAGNOSIS — G620 Drug-induced polyneuropathy: Secondary | ICD-10-CM

## 2023-10-25 DIAGNOSIS — M79675 Pain in left toe(s): Secondary | ICD-10-CM

## 2023-10-26 MED ORDER — CICLOPIROX 8 % EX SOLN: CUTANEOUS | 11 refills | Status: AC

## 2023-10-29 NOTE — Progress Notes (Signed)
 Subjective:  Patient ID: Mindy King, female    DOB: 1950/12/15,  MRN: [id number]  73 y.o. female presents at risk foot care with history of chemotherapy induced neuropathy and painful porokeratotic lesion(s) of both feet and painful mycotic toenails that limit ambulation. Painful toenails interfere with ambulation. Aggravating factors include wearing enclosed shoe gear. Pain is relieved with periodic professional debridement. Painful porokeratotic lesions are aggravated when weightbearing with and without shoegear. Pain is relieved with periodic professional debridement. She is requesting refill of Ciclopirox Solution for her toenails. Chief Complaint  Patient presents with   Nail Problem    Pt is here for [facility] PCP is Dr Yetta Barre and LOV was in February.   New problem(s): None   PCP is Iona Hansen, NP.  Allergies  Allergen Reactions   Ampicillin    Mometasone Furo-Formoterol Fum Diarrhea    Abdominal cramps    Review of Systems: Negative except as noted in the HPI.   Objective:  Mindy King is a pleasant 73 y.o. female thin build in NAD. AAO x 3.  Vascular Examination: Vascular status intact b/l with palpable pedal pulses. CFT immediate b/l. Pedal hair present. No edema. No pain with calf compression b/l. Skin temperature gradient WNL b/l. No varicosities noted. No cyanosis or clubbing noted.  Neurological Examination: Sensation grossly intact b/l with 10 gram monofilament. Vibratory sensation intact b/l.  Dermatological Examination: Pedal skin with normal turgor, texture and tone b/l. No open wounds nor interdigital macerations noted. Toenails 1-5 b/l thick, discolored, elongated with subungual debris and pain on dorsal palpation.   Porokeratotic lesion(s) bilateral great toes, L 3rd toe, L 4th toe, L 5th toe, B 2nd toe, submet head 1 right foot, submet head 4 right foot, and submet head 5 left foot. No erythema, no edema, no drainage, no fluctuance.  Musculoskeletal:  HAV  with bunion deformity noted b/l LE. Hammertoe deformity noted 2-5 b/l. Patient ambulates independent of any assistive aids.  Radiographs: None  Last A1c:       No data to display           Assessment:   1. Pain due to onychomycosis of toenails of both feet   2. Corns and callosities   3. Porokeratosis   4. Peripheral neuropathy due to chemotherapy [facility])    Plan:   Meds ordered this encounter  Medications   ciclopirox (PENLAC) 8 % solution    Sig: Apply one coat to each toenail daily for 48 weeks. Remove every 7 days with polish remover.    Dispense:  6.6 mL    Refill:  11  Consent given for treatment. Patient examined. All patient's and/or POA's questions/concerns addressed on today's visit. Toenails 1-5 debrided in length and girth without incident. Porokeratotic lesion(s) left great toe, left third digit, left fourth digit, left fifth digit, right great toe, submet head 1 right foot, submet head 4 right foot, and submet head 5 left foot pared and enucleated with sharp debridement without incident. Continue foot and shoe inspections daily. Monitor blood glucose per PCP/Endocrinologist's recommendations.Continue soft, supportive shoe gear daily. Report any pedal injuries to medical professional. Call office if there are any questions/concerns. -Patient/POA to call should there be question/concern in the interim.  Return in about 9 weeks (around 12/27/2023).  Freddie Breech, DPM      [place] LOCATION: [address]  [place], [place] [address]                   Office [phone]   [facility] LOCATION: [address] [place], [place] [address] Office [phone]

## 2023-11-03 ENCOUNTER — Other Ambulatory Visit: Payer: Self-pay

## 2023-11-03 ENCOUNTER — Encounter (HOSPITAL_COMMUNITY): Payer: Self-pay

## 2023-11-03 ENCOUNTER — Emergency Department (HOSPITAL_COMMUNITY)

## 2023-11-03 ENCOUNTER — Emergency Department (HOSPITAL_COMMUNITY)
Admission: EM | Admit: 2023-11-03 | Discharge: 2023-11-03 | Disposition: A | Discharge status: Home / Self Care | Attending: Emergency Medicine | Admitting: Emergency Medicine

## 2023-11-03 DIAGNOSIS — Z8616 Personal history of COVID-19: Secondary | ICD-10-CM | POA: Insufficient documentation

## 2023-11-03 DIAGNOSIS — Z85118 Personal history of other malignant neoplasm of bronchus and lung: Secondary | ICD-10-CM | POA: Insufficient documentation

## 2023-11-03 DIAGNOSIS — F17210 Nicotine dependence, cigarettes, uncomplicated: Secondary | ICD-10-CM | POA: Insufficient documentation

## 2023-11-03 DIAGNOSIS — M25511 Pain in right shoulder: Secondary | ICD-10-CM | POA: Insufficient documentation

## 2023-11-03 MED ORDER — KETOROLAC TROMETHAMINE 30 MG/ML IJ SOLN
30.0000 mg | Freq: Once | INTRAMUSCULAR | Status: AC
Start: 2023-11-03 — End: 2023-11-03
  Administered 2023-11-03: 30 mg via INTRAMUSCULAR

## 2023-11-03 MED ORDER — KETOROLAC TROMETHAMINE 30 MG/ML IJ SOLN
15.0000 mg | Freq: Once | INTRAMUSCULAR | Status: DC
  Filled 2023-11-03: qty 1

## 2023-11-03 MED ORDER — OXYCODONE HCL 5 MG PO TABS: 5.0000 mg | ORAL_TABLET | Freq: Four times a day (QID) | ORAL | 0 refills | Status: AC | PRN

## 2023-11-03 NOTE — ED Provider Notes (Signed)
 [place] EMERGENCY DEPARTMENT AT [facility] Provider Note  CSN: [id number] Arrival date & time: 11/03/23 1604  Chief Complaint(s) Arm Injury  HPI Mindy King is a 73 y.o. female history of lung cancer presenting to the emergency department with right shoulder pain.  She reports that she has had right shoulder pain for a few days.  No clear inciting event.  Reports she is taking Tylenol which does not help much.  No fevers or chills.  No chest pain, shortness of breath.  Pain worse with movement.  Radiates down the arm.  No swelling.  No other new symptoms.   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   Malignant neoplasm of lower lobe of left lung (HCC) 12/13/2018   Mixed hyperlipidemia 12/13/2018   Pulmonary emphysema (HCC) 12/13/2018   Thyroid nodule 12/13/2018   Tobacco abuse 12/13/2018   Onychomycosis 10/01/2018   Corns and callosities 10/01/2018   Allergic rhinitis 06/21/2018   Impacted cerumen of both ears 06/21/2018   Other nonspecific abnormal finding of lung field 03/28/2018   Pulmonary nodules 03/28/2018   Vertigo 02/21/2018   Low TSH level 01/07/2018   Multiple thyroid nodules 01/07/2018   Family history of atherosclerosis 09/14/2016   Chest pain 09/03/2015   Dyspnea 09/03/2015   Mitral and aortic incompetence 12/05/2013   Home Medication(s) Prior to Admission medications    Medication Sig Start Date End Date Taking? Authorizing Provider  oxyCODONE (ROXICODONE) 5 MG immediate release tablet Take 1 tablet (5 mg total) by mouth every 6 (six) hours as needed for severe pain (pain score 7-10). 11/03/23  Yes Lonell Grandchild, MD  acetaminophen (TYLENOL) 500 MG tablet Take 1,000 mg by mouth every 6 (six) hours as needed for fever.    [provider]  albuterol (VENTOLIN HFA) 108 (90 Base) MCG/ACT inhaler Inhale into the lungs. 02/18/21   [provider]  Ascorbic Acid 500 MG CHEW Chew by mouth.    [provider]  atorvastatin (LIPITOR) 20 MG tablet Take 1 tablet by mouth daily. 08/25/20   [provider]  azelastine (ASTELIN) 0.1 % nasal spray Place 1 spray into both nostrils daily as needed for rhinitis. 03/29/18   [provider]  B Complex-C (B-COMPLEX WITH VITAMIN C) tablet Take 1 tablet by mouth daily.    [provider]  Cholecalciferol 25 MCG (1000 UT) tablet Take 1,000 Units by mouth daily. 04/18/14   [provider]  ciclopirox (PENLAC) 8 % solution Apply one coat to each toenail daily for 48 weeks. Remove every 7 days with polish remover. 10/26/23   Freddie Breech, DPM  cyanocobalamin 100 MCG tablet Take 1 tablet by mouth daily. 08/25/20   [provider]  denosumab (PROLIA) 60 MG/ML SOSY injection Inject 60 mg into the skin every 6 (six) months. 03/28/18   [provider]  gabapentin (NEURONTIN) 100 MG capsule Take 1 capsule (100  mg total) by mouth at bedtime. 06/15/23   McDonald, Rachelle Hora, DPM  montelukast (SINGULAIR) 10 MG tablet Take 10 mg by mouth at bedtime.    [provider]  Multiple Vitamin (DAILY-VITE) TABS Take 1 tablet by mouth daily. 08/28/18   [provider]  omeprazole (PRILOSEC) 20 MG capsule Take 20 mg by mouth daily. 11/08/21   [provider]  ondansetron (ZOFRAN-ODT) 4 MG disintegrating tablet Take 1 tablet (4 mg total) by mouth every 8  (eight) hours as needed for nausea or vomiting. 05/12/22   Mardene Sayer, MD  sertraline (ZOLOFT) 50 MG tablet Take 50 mg by mouth every morning. 08/25/20   [provider]  SPIRIVA HANDIHALER 18 MCG inhalation capsule Place 1 capsule into inhaler and inhale daily. 05/22/18   [provider]  tetracycline (SUMYCIN) 500 MG capsule Take 500 mg by mouth 4 (four) times daily. 12/10/20   [provider]  valACYclovir (VALTREX) 1000 MG tablet Take one po daily x 30 days, then use one po x 5 days as needed for recurrent outbreaks. 02/18/21   [provider]  vitamin A 3 MG (10000 UNITS) capsule Take by mouth.    [provider]  vitamin E 45 MG (100 UNITS) capsule Take by mouth.    [provider]                                                                                                                                    Past Surgical History Past Surgical History:  Procedure Laterality Date   LUNG BIOPSY  2012   LUNG CANCER SURGERY  2012   Family History History reviewed. No pertinent family history.  Social History Social History   Tobacco Use   Smoking status: Some Days    Types: Cigarettes   Smokeless tobacco: Never  Substance Use Topics   Alcohol use: Not Currently   Drug use: Never   Allergies Ampicillin and Mometasone furo-formoterol fum  Review of Systems Review of Systems  All other systems reviewed and are negative.   Physical Exam Vital Signs  I have reviewed the triage vital signs BP (!) 159/87 (BP Location: Right Arm)   Pulse 91   Temp 99.2 F (37.3 C) (Oral)   Resp 18   Ht 5\' 4"  (1.626 m)   Wt 49.9 kg   SpO2 100%   BMI 18.88 kg/m  Physical Exam Vitals and nursing note reviewed.  Constitutional:      General: She is not in acute distress.    Appearance: She is well-developed.  HENT:     Head: Normocephalic and atraumatic.     Mouth/Throat:     Mouth: Mucous membranes are moist.  Eyes:      Pupils: Pupils are equal, round, and reactive to light.  Cardiovascular:     Rate and Rhythm: Normal rate and regular rhythm.     Heart sounds:  No murmur heard. Pulmonary:     Effort: Pulmonary effort is normal. No respiratory distress.     Breath sounds: Normal breath sounds.  Abdominal:     General: Abdomen is flat.     Palpations: Abdomen is soft.     Tenderness: There is no abdominal tenderness.  Musculoskeletal:     Right lower leg: No edema.     Left lower leg: No edema.     Comments: Some painful range of motion of the shoulder, but able to range the shoulder.  No edema to the right upper extremity.  Normal 2+ radial pulse to the right upper extremity.  No erythema.  No swelling.  No warmth.  Mild tenderness over the right shoulder.  No tenderness or limitation range of motion of the right elbow, wrist.  Skin:    General: Skin is warm and dry.  Neurological:     General: No focal deficit present.     Mental Status: She is alert. Mental status is at baseline.  Psychiatric:        Mood and Affect: Mood normal.        Behavior: Behavior normal.     ED Results and Treatments Labs (all labs ordered are listed, but only abnormal results are displayed) Labs Reviewed - No data to display                                                                                                                        Radiology DG Shoulder Right Result Date: 11/03/2023 CLINICAL DATA:  Shoulder pain EXAM: RIGHT SHOULDER - 2+ VIEW COMPARISON:  None Available. FINDINGS: There is no evidence of fracture or dislocation. There are moderate degenerative changes of the acromioclavicular joint with joint space narrowing and osteophyte formation. The glenohumeral joint space appears well maintained. Soft tissues are unremarkable. IMPRESSION: No acute fracture or dislocation of the right shoulder. Electronically Signed   By: Darliss Cheney M.D.   On: 11/03/2023 18:45    Pertinent labs & imaging results that  were available during my care of the patient were reviewed by me and considered in my medical decision making (see MDM for details).  Medications Ordered in ED Medications  ketorolac (TORADOL) 30 MG/ML injection 30 mg (30 mg Intramuscular Given 11/03/23 1757)  Procedures Procedures  (including critical care time)  Medical Decision Making / ED Course   MDM:  73 year old presenting to the emergency department with right shoulder pain.  Patient overall well-appearing, physical examination with painful range of motion of right shoulder, no erythema.  Normal circulation.  Unclear cause of symptoms, could be arthritis, will check x-ray.  No falls to suggest fracture.  Clinically not dislocated.  No fevers or chills, erythema, lower concern for septic joint, patient is able to range the shoulder.  Doubt circulatory problem such as arterial or venous process such as DVT,, no edema or swelling to the arm, normal pulse.  Will treat pain.  Will reassess.  Clinical Course as of 11/03/23 2041  Fri Nov 03, 2023  2039 Patient feels better after Toradol.  X-ray shows some arthritic changes of the [facility] joint which may be the cause of the patient's symptoms.  Patient's range of motion improved.  Low concern for dangerous process.  Recommended follow-up with orthopedic surgery, strict ER precautions for any fevers, increasing or severe pain, swelling, or any other new concerning symptoms. Will discharge patient to home. All questions answered. Patient comfortable with plan of discharge. Return precautions discussed with patient and specified on the after visit summary.  [WS]    Clinical Course User Index [WS] Lonell Grandchild, MD     Imaging Studies ordered: I ordered imaging studies including XR right shoulder  On my interpretation imaging demonstrates no fracture or  dislocation I independently visualized and interpreted imaging. I agree with the radiologist interpretation   Medicines ordered and prescription drug management: Meds ordered this encounter  Medications   DISCONTD: ketorolac (TORADOL) 30 MG/ML injection 15 mg   ketorolac (TORADOL) 30 MG/ML injection 30 mg   oxyCODONE (ROXICODONE) 5 MG immediate release tablet    Sig: Take 1 tablet (5 mg total) by mouth every 6 (six) hours as needed for severe pain (pain score 7-10).    Dispense:  12 tablet    Refill:  0    -I have reviewed the patients home medicines and have made adjustments as needed    Reevaluation: After the interventions noted above, I reevaluated the patient and found that their symptoms have improved  Co morbidities that complicate the patient evaluation  Past Medical History:  Diagnosis Date   Corns and callosities 10/01/2018   Hyperlipidemia    Lung cancer (HCC)    Onychomycosis 10/01/2018      Dispostion: Disposition decision including need for hospitalization was considered, and patient discharged from emergency department.    Final Clinical Impression(s) / ED Diagnoses Final diagnoses:  Acute pain of right shoulder     This chart was dictated using voice recognition software.  Despite best efforts to proofread,  errors can occur which can change the documentation meaning.    Lonell Grandchild, MD 11/03/23 2041

## 2023-11-03 NOTE — ED Triage Notes (Signed)
 Pt here for right arm pain that started a couple of days ago. Pt denies recent falls/ trauma/ strenuous activity. Denies CP.

## 2023-11-03 NOTE — Discharge Instructions (Addendum)
 We evaluated you for your shoulder pain.  Your x-ray showed some signs of arthritis.  You may have a rotator cuff problem.  We would recommend following up with an orthopedic surgeon.  Please take up as milligrams of Tylenol every 6 hours for pain.  We have prescribed you a small amount of oxycodone to take as needed.  Please do not mix with alcohol.  Please do not drive when taking this medication.  Please return for any new or worsening symptoms such as severe pain, fevers, swelling to your arm, color change in your arm, or any other new symptoms.

## 2023-12-14 ENCOUNTER — Ambulatory Visit: Payer: 59 | Admitting: Podiatry

## 2023-12-24 ENCOUNTER — Other Ambulatory Visit: Payer: Self-pay | Admitting: Podiatry

## 2024-01-01 ENCOUNTER — Ambulatory Visit (INDEPENDENT_AMBULATORY_CARE_PROVIDER_SITE_OTHER): Admitting: Podiatry

## 2024-01-01 ENCOUNTER — Encounter: Payer: Self-pay | Admitting: Podiatry

## 2024-01-01 DIAGNOSIS — M2011 Hallux valgus (acquired), right foot: Secondary | ICD-10-CM

## 2024-01-01 DIAGNOSIS — M21611 Bunion of right foot: Secondary | ICD-10-CM

## 2024-01-01 DIAGNOSIS — L909 Atrophic disorder of skin, unspecified: Secondary | ICD-10-CM | POA: Diagnosis not present

## 2024-01-01 DIAGNOSIS — M2041 Other hammer toe(s) (acquired), right foot: Secondary | ICD-10-CM | POA: Diagnosis not present

## 2024-01-01 DIAGNOSIS — M2012 Hallux valgus (acquired), left foot: Secondary | ICD-10-CM

## 2024-01-01 DIAGNOSIS — M7741 Metatarsalgia, right foot: Secondary | ICD-10-CM

## 2024-01-01 DIAGNOSIS — M2042 Other hammer toe(s) (acquired), left foot: Secondary | ICD-10-CM | POA: Diagnosis not present

## 2024-01-01 DIAGNOSIS — M7742 Metatarsalgia, left foot: Secondary | ICD-10-CM

## 2024-01-01 DIAGNOSIS — M21612 Bunion of left foot: Secondary | ICD-10-CM

## 2024-01-01 NOTE — Progress Notes (Signed)
  Subjective:  Patient ID: Mindy King, female    DOB: 05-12-1951,  MRN: [id number]  Chief Complaint  Patient presents with   Foot Pain    6 mos. follow up on neuropathy. Still having cramping in left foot toes mostly. 0 pain. Diet control diabetes. A1C 6.2.    73 y.o. female presents with the above complaint. History confirmed with patient.  Having continued pain and the gabapentin  has not helped  Objective:  Physical Exam: warm, good capillary refill, no trophic changes or ulcerative lesions, normal DP and PT pulses, normal sensory exam, and semirigid hallux valgus and hammertoe deformities of both feet, thinning of the metatarsal fat pad bilaterally tenderness on the plantar 2nd and 3rd metatarsal heads.  Assessment:   1. Metatarsalgia of both feet   2. Fat pad atrophy of foot   3. Hammertoe of left foot   4. Hammertoe of right foot   5. Hallux valgus with bunions, left   6. Hallux valgus with bunions, right      Plan:  Patient was evaluated and treated and all questions answered.  Having continued issues and pain.  We discussed further treatment options.  We discussed this can be quite difficult to alleviate completely there is not a good solution for fat pad atrophy, we discussed there is some newer treatment options such as a leneva injection that can stimulate fat pad ingrowth.  We discussed offloading with an orthotic she has orthotics that are been helpful but they are about 71 years old.  We discussed new custom molded accommodative orthoses and she will be scheduled for fitting for these.  We also discussed surgical options including MPJ fusion and pan metatarsal head resection and hammertoe corrections, currently with her cancer diagnosis she would prefer to avoid any surgery.  Follow-up with me as needed after her orthotics are made to reevaluate.  No follow-ups on file.

## 2024-02-06 ENCOUNTER — Other Ambulatory Visit

## 2024-02-13 ENCOUNTER — Encounter: Payer: Self-pay | Admitting: Podiatry

## 2024-02-13 ENCOUNTER — Ambulatory Visit (INDEPENDENT_AMBULATORY_CARE_PROVIDER_SITE_OTHER): Admitting: Podiatry

## 2024-02-13 DIAGNOSIS — M79674 Pain in right toe(s): Secondary | ICD-10-CM

## 2024-02-13 DIAGNOSIS — Q828 Other specified congenital malformations of skin: Secondary | ICD-10-CM

## 2024-02-13 DIAGNOSIS — G620 Drug-induced polyneuropathy: Secondary | ICD-10-CM | POA: Diagnosis not present

## 2024-02-13 DIAGNOSIS — M79675 Pain in left toe(s): Secondary | ICD-10-CM | POA: Diagnosis not present

## 2024-02-13 DIAGNOSIS — B351 Tinea unguium: Secondary | ICD-10-CM | POA: Diagnosis not present

## 2024-02-18 NOTE — Progress Notes (Signed)
Subjective:  Patient ID: Mindy King, female    DOB: 07-Dec-1950,  MRN: [id number]  Mindy King presents to clinic today for at risk foot care with h/o neuropathy secondary to chemotherapy and painful porokeratotic lesion(s) b/l feet and painful mycotic toenails that limit ambulation. Painful toenails interfere with ambulation. Aggravating factors include wearing enclosed shoe gear. Pain is relieved with periodic professional debridement. Painful porokeratotic lesions are aggravated when weightbearing with and without shoegear. Pain is relieved with periodic professional debridement.  Chief Complaint  Patient presents with   RFC     RFC non diabetic toenail trim.   Callouses    Callus care  bilateral. Non diabetic. 10 pain.    New problem(s): None.   PCP is Joshua Santana CROME, NP. Mindy King 12/14/2023.  Allergies  Allergen Reactions   Ampicillin    Mometasone Furo-Formoterol Fum Diarrhea    Abdominal cramps    Review of Systems: Negative except as noted in the HPI.  Objective: No changes noted in today's physical examination. There were no vitals filed for this visit. Mindy King is a pleasant 73 y.o. female thin build in NAD. AAO x 3.  Vascular Examination: Vascular status intact b/l with palpable pedal pulses. CFT immediate b/l. Pedal hair present. No edema. No pain with calf compression b/l. Skin temperature gradient WNL b/l. No varicosities noted. No cyanosis or clubbing noted.  Neurological Examination: Sensation grossly intact b/l with 10 gram monofilament. Vibratory sensation intact b/l.  Dermatological Examination: Pedal skin with normal turgor, texture and tone b/l. No open wounds nor interdigital macerations noted. Toenails 1-5 b/l thick, discolored, elongated with subungual debris and pain on dorsal palpation.   Porokeratotic lesion(s) bilateral great toes, L 3rd toe, L 4th toe, L 5th toe, B 2nd toe, submet head 1 right foot, submet head 4 right foot, and submet head 5 left  foot. No erythema, no edema, no drainage, no fluctuance.  Musculoskeletal:  HAV with bunion deformity noted b/l LE. Hammertoe deformity noted 2-5 b/l. Patient ambulates independent of any assistive aids.  Radiographs: None  Assessment/Plan: 1. Pain due to onychomycosis of toenails of both feet   2. 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 All questions/concerns addressed on today's visit. -Patient to continue soft, supportive shoe gear daily. -Mycotic toenails 1-5 bilaterally were debrided in length and girth with sterile nail nippers and dremel without incident. -Porokeratotic lesion(s) medial IPJ of left great toe, medial IPJ of right great toe, medial PIPJ of L 4th toe, lateral PIPJ of L 3rd toe, submet head 1 right foot, submet head 4 right foot, and submet head 5 left foot pared and enucleated with sterile currette without incident. Total number of lesions debrided=7. -Treatment was provided by assistant Andrez Manchester under my supervision. Patient/POA to call should there be question/concern in the interim.   Return in about 3 months (around 05/15/2024).  Delon CROME Merlin, DPM      [place] LOCATION: [address], [place] [address]                   Office [phone]   [place] LOCATION: [address] [place], [place] [address] Office [phone]  [phone]  

## 2024-02-21 ENCOUNTER — Ambulatory Visit

## 2024-02-21 DIAGNOSIS — M2141 Flat foot [pes planus] (acquired), right foot: Secondary | ICD-10-CM

## 2024-02-21 DIAGNOSIS — M7741 Metatarsalgia, right foot: Secondary | ICD-10-CM

## 2024-02-21 NOTE — Progress Notes (Signed)
 Orthotics   Patient was present and evaluated for Custom molded foot orthotics. Patient will benefit from CFO's to provide total contact to BIL MLA's helping to balance and distribute body weight more evenly across BIL feet helping to reduce plantar pressure and pain. Orthotic will also encourage FF / RF alignment  Patient was scanned today and will return for fitting upon receipt   Per [place] at [facility] [id number] is covered at 80% with 20% coins Deductible of $257 is met  No prior auth needed  Ref# [id number]  Lolita Schultze Cped, CFo, CFm

## 2024-03-03 NOTE — Progress Notes (Signed)
 [employer] orthotics ordered  Mindy King

## 2024-03-08 ENCOUNTER — Telehealth: Payer: Self-pay

## 2024-03-08 NOTE — Telephone Encounter (Signed)
 Pt called checking on the status of her orthotics. Per [facility] the order was cancelled. It looks like [employer] orthotics were ordered. I checked the Ipad and didn't see the pt in there

## 2024-03-20 NOTE — Telephone Encounter (Signed)
 Order placed with [employer] for orthotics on 03/03/24

## 2024-03-27 ENCOUNTER — Telehealth: Payer: Self-pay | Admitting: Podiatry

## 2024-03-27 NOTE — Telephone Encounter (Signed)
 Called to schedule orthotic fitting/ pu  Phone didn't ring/ couldn't leave message

## 2024-03-31 ENCOUNTER — Encounter (HOSPITAL_COMMUNITY): Payer: Self-pay | Admitting: Radiology

## 2024-03-31 ENCOUNTER — Emergency Department (HOSPITAL_COMMUNITY)
Admission: EM | Admit: 2024-03-31 | Discharge: 2024-04-01 | Disposition: A | Discharge status: Home / Self Care | Attending: Emergency Medicine | Admitting: Emergency Medicine

## 2024-03-31 ENCOUNTER — Other Ambulatory Visit: Payer: Self-pay

## 2024-03-31 ENCOUNTER — Emergency Department (HOSPITAL_COMMUNITY)

## 2024-03-31 DIAGNOSIS — Z85118 Personal history of other malignant neoplasm of bronchus and lung: Secondary | ICD-10-CM | POA: Insufficient documentation

## 2024-03-31 DIAGNOSIS — R42 Dizziness and giddiness: Secondary | ICD-10-CM | POA: Diagnosis present

## 2024-03-31 LAB — COMPREHENSIVE METABOLIC PANEL WITH GFR
ALT: 26 U/L (ref 0–44)
AST: 30 U/L (ref 15–41)
Albumin: 3.3 g/dL — ABNORMAL LOW (ref 3.5–5.0)
Alkaline Phosphatase: 77 U/L (ref 38–126)
Anion gap: 8 (ref 5–15)
BUN: 12 mg/dL (ref 8–23)
CO2: 24 mmol/L (ref 22–32)
Calcium: 9 mg/dL (ref 8.9–10.3)
Chloride: 107 mmol/L (ref 98–111)
Creatinine, Ser: 0.85 mg/dL (ref 0.44–1.00)
GFR, Estimated: >60 mL/min (ref >60)
Glucose, Bld: 126 mg/dL — ABNORMAL HIGH (ref 70–99)
Potassium: 4 mmol/L (ref 3.5–5.1)
Sodium: 139 mmol/L (ref 135–145)
Total Bilirubin: 0.4 mg/dL (ref 0.0–1.2)
Total Protein: 6.3 g/dL — ABNORMAL LOW (ref 6.5–8.1)

## 2024-03-31 LAB — URINALYSIS, ROUTINE W REFLEX MICROSCOPIC
Bilirubin Urine: NEGATIVE
Glucose, UA: NEGATIVE mg/dL
Hgb urine dipstick: NEGATIVE
Ketones, ur: NEGATIVE mg/dL
Nitrite: NEGATIVE
Protein, ur: NEGATIVE mg/dL
Specific Gravity, Urine: 1.021 (ref 1.005–1.030)
pH: 5 (ref 5.0–8.0)

## 2024-03-31 LAB — RESP PANEL BY RT-PCR (RSV, FLU A&B, COVID)  RVPGX2
Influenza A by PCR: NEGATIVE
Influenza B by PCR: NEGATIVE
Resp Syncytial Virus by PCR: NEGATIVE
SARS Coronavirus 2 by RT PCR: NEGATIVE

## 2024-03-31 LAB — CBC
HCT: 42.2 % (ref 36.0–46.0)
Hemoglobin: 13.5 g/dL (ref 12.0–15.0)
MCH: 29.1 pg (ref 26.0–34.0)
MCHC: 32 g/dL (ref 30.0–36.0)
MCV: 90.9 fL (ref 80.0–100.0)
Platelets: 224 K/uL (ref 150–400)
RBC: 4.64 MIL/uL (ref 3.87–5.11)
RDW: 14.6 % (ref 11.5–15.5)
WBC: 6.2 K/uL (ref 4.0–10.5)
nRBC: 0 % (ref 0.0–0.2)

## 2024-03-31 NOTE — ED Notes (Signed)
 She also endorses having felt stuffy and having bil temple pain for the entire week as well.

## 2024-03-31 NOTE — ED Triage Notes (Signed)
 Pt states she has been dizzy for the past week. She states that it gets bad when she stands but when she lays down she also is very dizzy.She has not had it in the past. She states that when she stands she feels like she is going to fall over. She states that she feels like she is spinning and that the room is spinning. She also endorses facial twitches on the right side.

## 2024-04-01 ENCOUNTER — Emergency Department (HOSPITAL_COMMUNITY)

## 2024-04-01 DIAGNOSIS — R42 Dizziness and giddiness: Secondary | ICD-10-CM | POA: Diagnosis not present

## 2024-04-01 MED ORDER — MECLIZINE HCL 12.5 MG PO TABS: 12.5000 mg | ORAL_TABLET | Freq: Three times a day (TID) | ORAL | 0 refills | Status: AC | PRN

## 2024-04-01 MED ORDER — DIAZEPAM 5 MG/ML IJ SOLN
2.5000 mg | Freq: Once | INTRAMUSCULAR | Status: AC
  Administered 2024-04-01: 2.5 mg via INTRAVENOUS
  Filled 2024-04-01: qty 2

## 2024-04-01 MED ORDER — IOHEXOL 350 MG/ML SOLN
75.0000 mL | Freq: Once | INTRAVENOUS | Status: AC | PRN
  Administered 2024-04-01: 75 mL via INTRAVENOUS

## 2024-04-01 MED ORDER — GADOBUTROL 1 MMOL/ML IV SOLN
5.0000 mL | Freq: Once | INTRAVENOUS | Status: AC | PRN
  Administered 2024-04-01: 5 mL via INTRAVENOUS

## 2024-04-01 MED ORDER — SODIUM CHLORIDE 0.9 % IV BOLUS
1000.0000 mL | Freq: Once | INTRAVENOUS | Status: AC
  Administered 2024-04-01: 1000 mL via INTRAVENOUS

## 2024-04-01 NOTE — ED Notes (Signed)
 Returned from MRI. Went to put pt on VS monitoring and pt refused.

## 2024-04-01 NOTE — ED Provider Notes (Signed)
 [place] EMERGENCY DEPARTMENT AT [facility] Provider Note   CSN: [id number] Arrival date & time: 03/31/24  2120     Patient presents with: No chief complaint on file.   Mindy King is a 73 y.o. female.   Presents with complaints of dizziness.  Patient feels off balance, sometimes while she is standing she feels like she is going to pass out.  She feels like she might have vertigo but has no history of.  Patient does have a history of terminal lung cancer, not pursuing treatment.  She is not experiencing chest pain, palpitations.  No change in her breathing.       Prior to Admission medications   Medication Sig Start Date End Date Taking? Authorizing Provider  meclizine  (ANTIVERT ) 12.5 MG tablet Take 1 tablet (12.5 mg total) by mouth 3 (three) times daily as needed for dizziness. 04/01/24  Yes Karilynn Carranza, Lonni PARAS, MD  acetaminophen  (TYLENOL ) 500 MG tablet Take 1,000 mg by mouth every 6 (six) hours as needed for fever.    [provider]  albuterol  (VENTOLIN  HFA) 108 (90 Base) MCG/ACT inhaler Inhale into the lungs. 02/18/21   [provider]  Ascorbic Acid  500 MG CHEW Chew by mouth.    [provider]  atorvastatin  (LIPITOR) 20 MG tablet Take 1 tablet by mouth daily. 08/25/20   [provider]  azelastine (ASTELIN) 0.1 % nasal spray Place 1 spray into both nostrils daily as needed for rhinitis. 03/29/18   [provider]  B Complex-C (B-COMPLEX WITH VITAMIN C) tablet Take 1 tablet by mouth daily.    [provider]  Cholecalciferol 25 MCG (1000 UT) tablet Take 1,000 Units by mouth daily. 04/18/14   [provider]  ciclopirox  (PENLAC ) 8 % solution Apply one coat to each toenail daily for 48 weeks. Remove every 7 days with polish remover. 10/26/23   Gaynel Delon CROME, DPM  cyanocobalamin 100 MCG tablet Take 1 tablet by mouth daily. 08/25/20   [provider]  denosumab (PROLIA) 60 MG/ML SOSY injection  Inject 60 mg into the skin every 6 (six) months. 03/28/18   [provider]  gabapentin  (NEURONTIN ) 100 MG capsule TAKE 1 CAPSULE(100 MG) BY MOUTH AT BEDTIME 12/26/23   McDonald, Adam R, DPM  montelukast  (SINGULAIR ) 10 MG tablet Take 10 mg by mouth at bedtime.    [provider]  Multiple Vitamin (DAILY-VITE) TABS Take 1 tablet by mouth daily. 08/28/18   [provider]  omeprazole (PRILOSEC) 20 MG capsule Take 20 mg by mouth daily. 11/08/21   [provider]  ondansetron  (ZOFRAN -ODT) 4 MG disintegrating tablet Take 1 tablet (4 mg total) by mouth every 8 (eight) hours as needed for nausea or vomiting. 05/12/22   Ethyl Richerd BROCKS, MD  oxyCODONE  (ROXICODONE ) 5 MG immediate release tablet Take 1 tablet (5 mg total) by mouth every 6 (six) hours as needed for severe pain (pain score 7-10). 11/03/23   Francesca Elsie CROME, MD  sertraline (ZOLOFT) 50 MG tablet Take 50 mg by mouth every morning. 08/25/20   [provider]  SPIRIVA  HANDIHALER 18 MCG inhalation capsule Place 1 capsule into inhaler and inhale daily. 05/22/18   [provider]  tetracycline (SUMYCIN) 500 MG capsule Take 500 mg by mouth 4 (four) times daily. 12/10/20   [provider]  valACYclovir (VALTREX) 1000 MG tablet Take one po daily x 30 days, then use one po x 5 days as needed for recurrent outbreaks. 02/18/21  [provider]  vitamin A 3 MG (10000 UNITS) capsule Take by mouth.    [provider]  vitamin E 45 MG (100 UNITS) capsule Take by mouth.    [provider]    Allergies: Ampicillin and Mometasone furo-formoterol fum    Review of Systems  Updated Vital Signs BP (!) 123/91   Pulse 70   Temp 98.6 F (37 C)   Resp 18   Ht 5' 4 (1.626 m)   Wt 49.9 kg   SpO2 100%   BMI 18.88 kg/m   Physical Exam Vitals and nursing note reviewed.  Constitutional:      General: She is not in acute distress.    Appearance: She is well-developed.   HENT:     Head: Normocephalic and atraumatic.     Mouth/Throat:     Mouth: Mucous membranes are moist.  Eyes:     General: Vision grossly intact. Gaze aligned appropriately.     Extraocular Movements: Extraocular movements intact.     Conjunctiva/sclera: Conjunctivae normal.  Cardiovascular:     Rate and Rhythm: Normal rate and regular rhythm.     Pulses: Normal pulses.     Heart sounds: Normal heart sounds, S1 normal and S2 normal. No murmur heard.    No friction rub. No gallop.  Pulmonary:     Effort: Pulmonary effort is normal. No respiratory distress.     Breath sounds: Normal breath sounds.  Abdominal:     General: Bowel sounds are normal.     Palpations: Abdomen is soft.     Tenderness: There is no abdominal tenderness. There is no guarding or rebound.     Hernia: No hernia is present.  Musculoskeletal:        General: No swelling.     Cervical back: Full passive range of motion without pain, normal range of motion and neck supple. No spinous process tenderness or muscular tenderness. Normal range of motion.     Right lower leg: No edema.     Left lower leg: No edema.  Skin:    General: Skin is warm and dry.     Capillary Refill: Capillary refill takes less than 2 seconds.     Findings: No ecchymosis, erythema, rash or wound.  Neurological:     General: No focal deficit present.     Mental Status: She is alert and oriented to person, place, and time.     GCS: GCS eye subscore is 4. GCS verbal subscore is 5. GCS motor subscore is 6.     Cranial Nerves: Cranial nerves 2-12 are intact.     Sensory: Sensation is intact.     Motor: Motor function is intact.     Coordination: Coordination is intact.  Psychiatric:        Attention and Perception: Attention normal.        Mood and Affect: Mood normal.        Speech: Speech normal.        Behavior: Behavior normal.     (all labs ordered are listed, but only abnormal results are displayed) Labs Reviewed  COMPREHENSIVE  METABOLIC PANEL WITH GFR - Abnormal; Notable for the following components:      Result Value   Glucose, Bld 126 (*)    Total Protein 6.3 (*)    Albumin 3.3 (*)    All other components within normal limits  URINALYSIS, ROUTINE W REFLEX MICROSCOPIC - Abnormal; Notable for the following components:   Color, Urine  AMBER (*)    APPearance HAZY (*)    Leukocytes,Ua SMALL (*)    Bacteria, UA RARE (*)    All other components within normal limits  RESP PANEL BY RT-PCR (RSV, FLU A&B, COVID)  RVPGX2  CBC  CBG MONITORING, ED    EKG: EKG Interpretation Date/Time:  Sunday March 31 2024 21:42:05 EDT Ventricular Rate:  83 PR Interval:  138 QRS Duration:  74 QT Interval:  398 QTC Calculation: 467 R Axis:   88  Text Interpretation: Normal sinus rhythm Nonspecific ST abnormality Abnormal ECG When compared with ECG of 17-Jul-2020 11:43, No acute changes Confirmed by Haze Lonni PARAS [phone]) on 04/01/2024 12:30:17 AM  Radiology: MR Brain W and Wo Contrast Result Date: 04/01/2024 CLINICAL DATA:  Initial evaluation for acute syncope/presyncope. Evaluation for metastatic disease. EXAM: MRI HEAD WITHOUT AND WITH CONTRAST TECHNIQUE: Multiplanar, multiecho pulse sequences of the brain and surrounding structures were obtained without and with intravenous contrast. CONTRAST:  5mL GADAVIST  GADOBUTROL  1 MMOL/ML IV SOLN COMPARISON:  Prior CT from earlier the same day as well as previous MRI from 10/26/2022. FINDINGS: Brain: Cerebral volume within normal limits. Patchy T2/FLAIR hyperintensity involving the periventricular and deep white matter both cerebral hemispheres as well as the pons, most characteristic of chronic microvascular ischemic disease, moderate in nature, and similar to prior. Few scattered small remote cerebellar infarcts noted, left greater than right. No abnormal foci of restricted diffusion to suggest acute or subacute ischemia. Gray-white matter differentiation maintained. No areas of chronic  cortical infarction. No acute or chronic intracranial blood products. No mass lesion, midline shift or mass effect. No hydrocephalus or extra-axial fluid collection. 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 Pituitary gland and suprasellar region otherwise unremarkable. No abnormal enhancement or evidence for intracranial metastatic disease. Vascular: Major intracranial vascular flow voids are maintained. Skull and upper cervical spine: Craniocervical junction within normal limits. Bone marrow signal intensity overall within normal limits. No scalp soft tissue abnormality. Sinuses/Orbits: Globes orbital soft tissues within normal limits. Paranasal sinuses are largely clear. No significant mastoid effusion. Other: None. IMPRESSION: 1. No acute intracranial abnormality. No evidence for intracranial metastatic disease. 2. Moderate chronic microvascular ischemic disease, similar to prior. 3. Few scattered small remote cerebellar infarcts, left greater than right. 4. 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 Electronically Signed   By: Morene Hoard M.D.   On: 04/01/2024 02:48   CT ANGIO HEAD NECK W WO CM Result Date: 04/01/2024 CLINICAL DATA:  Initial evaluation for acute dizziness. EXAM: CT ANGIOGRAPHY HEAD AND NECK WITH AND WITHOUT CONTRAST TECHNIQUE: Multidetector CT imaging of the head and neck was performed using the standard protocol during bolus administration of intravenous contrast. Multiplanar CT image reconstructions and MIPs were obtained to evaluate the vascular anatomy. Carotid stenosis measurements (when applicable) are obtained utilizing NASCET criteria, using the distal internal carotid diameter as the denominator. RADIATION DOSE REDUCTION: This exam was performed according to the departmental dose-optimization program which includes automated exposure control, adjustment  of the mA and/or kV according to patient size and/or use of iterative reconstruction technique. CONTRAST:  75mL OMNIPAQUE  IOHEXOL  350 MG/ML SOLN COMPARISON:  Prior study from 07/01/2023 FINDINGS: CT HEAD FINDINGS Brain: Cerebral volume within normal limits. Small remote left cerebellar infarct noted. No acute intracranial hemorrhage. No acute large vessel territory infarct. No mass lesion or midline shift. No hydrocephalus or extra-axial fluid collection. Vascular: No  hyperdense vessel. Skull: Scalp soft tissues within normal limits.  Calvarium intact. Sinuses/Orbits: Globes orbital soft tissues within normal limits. Paranasal sinuses and mastoid air cells are clear. Other: None. Review of the MIP images confirms the above findings CTA NECK FINDINGS Aortic arch: Evaluation of the aortic arch and origin of great vessels markedly limited by extensive streak artifact from dense venous contamination. Arch grossly within normal limits for caliber. No visible stenosis about the origin the great vessels. Right carotid system: Right common and internal carotid arteries are patent without stenosis or dissection. Left carotid system: Evaluation of the proximal left CCA limited by artifact. Visualized left common and internal carotid arteries patent without stenosis or dissection. Vertebral arteries: Proximal vertebral arteries limited assessment due to venous contamination and artifact, particularly on the left. Visualized vertebral arteries patent without stenosis or dissection. Right vertebral artery dominant. Skeleton: No worrisome osseous lesions. Mild degenerative spondylosis at C5-6. Other neck: No other acute finding. Upper chest: Few scattered irregular pulmonary nodules noted within the visualized lungs, grossly similar as compared to recent chest CT from 02/27/2024. No other acute finding. Review of the MIP images confirms the above findings CTA HEAD FINDINGS Anterior circulation: Mild atheromatous change about the  carotid siphons without stenosis. A1 segments patent bilaterally. Normal anterior communicating artery complex. Anterior cerebral arteries patent without significant stenosis. No M1 stenosis or occlusion. Distal MCA branches perfused and symmetric. Posterior circulation: Dominant right V4 segment widely patent. Diminutive left vertebral artery largely terminates in PICA, although a small branch AA sensory to the vertebrobasilar junction. Left PICA patent. Right PICA not well seen. Basilar widely patent without stenosis. Superior cerebellar and posterior cerebral arteries patent bilaterally. Venous sinuses: Patent allowing for timing the contrast bolus. Anatomic variants: As above.  No aneurysm. Review of the MIP images confirms the above findings IMPRESSION: CT HEAD: 1. No acute intracranial abnormality. 2. Small remote left cerebellar infarct. CTA HEAD AND NECK: 1. Negative CTA for large vessel occlusion or other emergent finding. 2. Mild for age atheromatous disease, with no hemodynamically significant or correctable stenosis. 3. Scattered irregular and spiculated pulmonary nodules within the visualized lungs, grossly similar as compared to recent chest CT from 02/27/2024 where these findings are fully described. Electronically Signed   By: Morene Hoard M.D.   On: 04/01/2024 01:55   DG Chest Portable 1 View Result Date: 03/31/2024 CLINICAL DATA:  Dizziness.  History of lung cancer. EXAM: PORTABLE CHEST 1 VIEW COMPARISON:  Chest x-ray 07/01/2023.  CT of the chest 02/27/2024. FINDINGS: Left upper lobe nodular densities appear unchanged from recent CT. There surgical clips in the left hilar region. Previously identified right lower lobe nodular density, not definitely seen on current x-ray. No new lung infiltrate, pleural effusion or pneumothorax identified. The cardiomediastinal silhouette is within normal limits. No acute fractures are seen. IMPRESSION: 1. No acute cardiopulmonary process. 2. Left upper  lobe nodular densities appear unchanged from recent CT. Electronically Signed   By: Greig Pique M.D.   On: 03/31/2024 22:48     Procedures   Medications Ordered in the ED  sodium chloride  0.9 % bolus 1,000 mL (1,000 mLs Intravenous New Bag/Given 04/01/24 0040)  diazepam  (VALIUM ) injection 2.5 mg (2.5 mg Intravenous Given 04/01/24 0129)  iohexol  (OMNIPAQUE ) 350 MG/ML injection 75 mL (75 mLs Intravenous Contrast Given 04/01/24 0106)  gadobutrol  (GADAVIST ) 1 MMOL/ML injection 5 mL (5 mLs Intravenous Contrast Given 04/01/24 0204)  Medical Decision Making Amount and/or Complexity of Data Reviewed External Data Reviewed: labs, radiology, ECG and notes. Labs: ordered. Decision-making details documented in ED Course. Radiology: ordered and independent interpretation performed. Decision-making details documented in ED Course. ECG/medicine tests: ordered and independent interpretation performed. Decision-making details documented in ED Course.  Risk Prescription drug management.   Differential Diagnosis considered includes, but not limited to: Stroke; TIA; vertebrobasilar insufficiency; peripheral vertigo; orthostatic hypotension   Presents to the emergency department for evaluation of dizziness.  Patient having difficulty walking, sometimes her like she is going to pass out.  No associated chest pain, shortness of breath.  Exam unremarkable, no focal findings.  She reports being dizzy sitting at rest.  Not clearly vertiginous.  She does have a history of untreated lung cancer.  CT head unremarkable.  CT angio head and neck without acute findings.  Patient went for MRI with and without contrast to further evaluate.  No evidence of stroke or metastatic disease.  No cardiopulmonary complaints.  Patient given IV fluids here, vital signs unremarkable.  Trial of meclizine , follow-up with primary care.     Final diagnoses:  Dizziness    ED Discharge Orders           Ordered    meclizine  (ANTIVERT ) 12.5 MG tablet  3 times daily PRN        04/01/24 0319               Haze Lonni PARAS, MD 04/01/24 [phone]

## 2024-04-16 DIAGNOSIS — Z8673 Personal history of transient ischemic attack (TIA), and cerebral infarction without residual deficits: Secondary | ICD-10-CM | POA: Insufficient documentation

## 2024-04-30 ENCOUNTER — Ambulatory Visit (INDEPENDENT_AMBULATORY_CARE_PROVIDER_SITE_OTHER)

## 2024-04-30 DIAGNOSIS — M7742 Metatarsalgia, left foot: Secondary | ICD-10-CM

## 2024-04-30 DIAGNOSIS — M7741 Metatarsalgia, right foot: Secondary | ICD-10-CM

## 2024-04-30 DIAGNOSIS — M2141 Flat foot [pes planus] (acquired), right foot: Secondary | ICD-10-CM | POA: Diagnosis not present

## 2024-04-30 DIAGNOSIS — M2142 Flat foot [pes planus] (acquired), left foot: Secondary | ICD-10-CM

## 2024-04-30 NOTE — Progress Notes (Signed)
 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   Orthotics were dispensed and fit was satisfactory. Reviewed instructions for break-in and wear. Written instructions given to patient.  Patient will follow up as needed.  Lolita Schultze CPed, CFo, CFm

## 2024-05-29 ENCOUNTER — Encounter: Payer: Self-pay | Admitting: Podiatry

## 2024-05-29 ENCOUNTER — Ambulatory Visit: Admitting: Podiatry

## 2024-05-29 DIAGNOSIS — M79674 Pain in right toe(s): Secondary | ICD-10-CM

## 2024-05-29 DIAGNOSIS — M79675 Pain in left toe(s): Secondary | ICD-10-CM | POA: Diagnosis not present

## 2024-05-29 DIAGNOSIS — T451X5A Adverse effect of antineoplastic and immunosuppressive drugs, initial encounter: Secondary | ICD-10-CM | POA: Diagnosis not present

## 2024-05-29 DIAGNOSIS — B351 Tinea unguium: Secondary | ICD-10-CM | POA: Diagnosis not present

## 2024-05-29 DIAGNOSIS — G620 Drug-induced polyneuropathy: Secondary | ICD-10-CM

## 2024-05-29 DIAGNOSIS — Q828 Other specified congenital malformations of skin: Secondary | ICD-10-CM

## 2024-06-02 ENCOUNTER — Encounter: Payer: Self-pay | Admitting: Podiatry

## 2024-06-02 NOTE — Progress Notes (Signed)
  Subjective:  Patient ID: Mindy King, female    DOB: 09/16/1950,  MRN: [id number]  Mindy King presents to clinic today for at risk foot care with h/o neuropathy secondary to chemotherapy and painful porokeratotic lesion(s) of both feet and painful mycotic toenails that limit ambulation. Painful toenails interfere with ambulation. Aggravating factors include wearing enclosed shoe gear. Pain is relieved with periodic professional debridement. Painful porokeratotic lesions are aggravated when weightbearing with and without shoegear. Pain is relieved with periodic professional debridement.  Chief Complaint  Patient presents with   Diabetes    [facility] Toenail trim LOV with PCP 04/15/24.   New problem(s): None.   PCP is Joshua Santana CROME, NP.  Allergies  Allergen Reactions   Ampicillin    Mometasone Furo-Formoterol Fum Diarrhea    Abdominal cramps    Review of Systems: Negative except as noted in the HPI.  Objective: No changes noted in today's physical examination. There were no vitals filed for this visit. Mindy King is a pleasant 73 y.o. female thin build in NAD. AAO x 3.  Vascular Examination: Capillary refill time immediate b/l. Palpable pedal pulses. Pedal hair absent b/l. Pedal edema absent. No pain with calf compression b/l. Skin temperature gradient WNL b/l. No cyanosis or clubbing b/l. No ischemia or gangrene noted b/l.   Neurological Examination: Sensation grossly intact b/l with 10 gram monofilament. Vibratory sensation intact b/l.   Dermatological Examination: Pedal skin with normal turgor, texture and tone b/l.  No open wounds. No interdigital macerations.   Toenails 1-5 b/l thick, discolored, elongated with subungual debris and pain on dorsal palpation.   Porokeratotic lesion(s) lateral IPJ of R hallux, medial PIPJ of R 2nd toe, lateral PIPJ of L 3rd toe, submet head 1 right foot, submet head 4 right foot, and submet head 5 left foot. No erythema, no edema, no drainage, no  fluctuance.  Musculoskeletal Examination: Muscle strength 5/5 to all lower extremity muscle groups bilaterally. HAV with bunion bilaterally and hammertoes 2-5 b/l.SABRA No pain, crepitus or joint limitation noted with ROM b/l LE.  Patient ambulates independently without assistive aids.  Radiographs: None  Assessment/Plan: 1. Pain due to onychomycosis of toenails of both feet   2. 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 -Toenails 1-5 b/l were debrided in length and girth with sterile nail nippers and dremel without iatrogenic bleeding.  -Porokeratotic lesion(s) lateral PJ of left great toe, lateral IPJ of right great toe, medial PIPJ of right second digit, lateral PIPJ left 3rd digit, submet head 1 right foot, submet head 4 right foot, and submet head 5 left foot pared and enucleated with sterile currette without incident. Total number of lesions debrided. -Patient/POA to call should there be question/concern in the interim.   Return in about 3 months (around 08/29/2024).  Mindy King, DPM      [place] LOCATION: [address] [place] [address]                   Office [phone]   [facility] LOCATION: [address] [place], [place] [address] Office [phone]

## 2024-09-11 ENCOUNTER — Ambulatory Visit: Admitting: Podiatry

## 2024-12-04 ENCOUNTER — Ambulatory Visit: Admitting: Podiatry

## 8859-04-02 DEATH — deceased
